# Patient Record
Sex: Female | Born: 2000
Health system: Southern US, Community
[De-identification: ages and names within clinical notes are randomized; demographics above are authoritative.]

## PROBLEM LIST (undated history)

## (undated) DIAGNOSIS — N946 Dysmenorrhea, unspecified: Secondary | ICD-10-CM

## (undated) DIAGNOSIS — T7840XA Allergy, unspecified, initial encounter: Secondary | ICD-10-CM

## (undated) DIAGNOSIS — F32A Depression, unspecified: Secondary | ICD-10-CM

## (undated) DIAGNOSIS — F329 Major depressive disorder, single episode, unspecified: Secondary | ICD-10-CM

## (undated) DIAGNOSIS — F419 Anxiety disorder, unspecified: Secondary | ICD-10-CM

## (undated) HISTORY — DX: Allergy, unspecified, initial encounter: T78.40XA

## (undated) HISTORY — DX: Dysmenorrhea, unspecified: N94.6

## (undated) HISTORY — DX: Depression, unspecified: F32.A

## (undated) HISTORY — DX: Major depressive disorder, single episode, unspecified: F32.9

## (undated) HISTORY — DX: Anxiety disorder, unspecified: F41.9

## (undated) HISTORY — PX: OTHER SURGICAL HISTORY: SHX169

## (undated) HISTORY — PX: MOLE REMOVAL: SHX2046

---
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
## 2019-02-05 ENCOUNTER — Other Ambulatory Visit: Payer: Self-pay | Admitting: Family Medicine

## 2019-04-08 ENCOUNTER — Other Ambulatory Visit: Payer: Self-pay | Admitting: Ophthalmology

## 2019-04-21 ENCOUNTER — Encounter: Payer: 59 | Admitting: Family Medicine

## 2019-05-10 ENCOUNTER — Encounter: Payer: Self-pay | Admitting: *Deleted

## 2019-05-10 ENCOUNTER — Encounter: Payer: Self-pay | Admitting: Family Medicine

## 2019-05-12 ENCOUNTER — Encounter: Payer: Self-pay | Admitting: Family Medicine

## 2019-05-12 ENCOUNTER — Ambulatory Visit (INDEPENDENT_AMBULATORY_CARE_PROVIDER_SITE_OTHER): Payer: 59 | Admitting: Family Medicine

## 2019-05-12 ENCOUNTER — Other Ambulatory Visit: Payer: Self-pay

## 2019-05-12 VITALS — BP 106/66 | HR 70 | Temp 98.4°F | Resp 14 | Ht 66.25 in | Wt 141.0 lb

## 2019-05-12 DIAGNOSIS — F411 Generalized anxiety disorder: Secondary | ICD-10-CM

## 2019-05-12 DIAGNOSIS — Z00129 Encounter for routine child health examination without abnormal findings: Secondary | ICD-10-CM | POA: Diagnosis not present

## 2019-05-12 DIAGNOSIS — Z23 Encounter for immunization: Secondary | ICD-10-CM

## 2019-05-12 MED ORDER — FLUOXETINE HCL 10 MG PO TABS: 10.0000 mg | ORAL_TABLET | Freq: Every day | ORAL | 3 refills | Status: DC

## 2019-05-12 NOTE — Patient Instructions (Addendum)
Please return in 6 weeks to recheck your mood.   Let's slowly decrease your prozac as follows: Take '10mg'$  prozac on Th and Sunday of this week; '20mg'$  all other day; Then next week, take '10mg'$  prozac on M, W, F, '20mg'$  all other days; Then take '10mg'$  MWF Saturday and Sunday, with '20mg'$  on Tu and th, Then start '10mg'$  daily.    If you have any questions or concerns, please don't hesitate to send me a message via MyChart or call the office at [phone]. Thank you for visiting with [place] today! It's our pleasure caring for you.   Preventive Care 51-54 Years Old, Female Preventive care refers to visits with your health care provider and lifestyle choices that can promote health and wellness. This includes:  A yearly physical exam. This may also be called an annual well check.  Regular dental visits and eye exams.  Immunizations.  Screening for certain conditions.  Healthy lifestyle choices, such as eating a healthy diet, getting regular exercise, not using drugs or products that contain nicotine and tobacco, and limiting alcohol use. What can I expect for my preventive care visit? Physical exam Your health care provider will check your:  Height and weight. This may be used to calculate body mass index (BMI), which tells if you are at a healthy weight.  Heart rate and blood pressure.  Skin for abnormal spots. Counseling Your health care provider may ask you questions about your:  Alcohol, tobacco, and drug use.  Emotional well-being.  Home and relationship well-being.  Sexual activity.  Eating habits.  Work and work Statistician.  Method of birth control.  Menstrual cycle.  Pregnancy history. What immunizations do I need?  Influenza (flu) vaccine  This is recommended every year. Tetanus, diphtheria, and pertussis (Tdap) vaccine  You may need a Td booster every 10 years. Varicella (chickenpox) vaccine  You may need this if you have not been vaccinated. Human  papillomavirus (HPV) vaccine  If recommended by your health care provider, you may need three doses over 6 months. Measles, mumps, and rubella (MMR) vaccine  You may need at least one dose of MMR. You may also need a second dose. Meningococcal conjugate (MenACWY) vaccine  One dose is recommended if you are age 79-21 years and a first-year college student living in a residence hall, or if you have one of several medical conditions. You may also need additional booster doses. Pneumococcal conjugate (PCV13) vaccine  You may need this if you have certain conditions and were not previously vaccinated. Pneumococcal polysaccharide (PPSV23) vaccine  You may need one or two doses if you smoke cigarettes or if you have certain conditions. Hepatitis A vaccine  You may need this if you have certain conditions or if you travel or work in places where you may be exposed to hepatitis A. Hepatitis B vaccine  You may need this if you have certain conditions or if you travel or work in places where you may be exposed to hepatitis B. Haemophilus influenzae type b (Hib) vaccine  You may need this if you have certain conditions. You may receive vaccines as individual doses or as more than one vaccine together in one shot (combination vaccines). Talk with your health care provider about the risks and benefits of combination vaccines. What tests do I need?  Blood tests  Lipid and cholesterol levels. These may be checked every 5 years starting at age 79.  Hepatitis C test.  Hepatitis B test. Screening  Diabetes screening. This  is done by checking your blood sugar (glucose) after you have not eaten for a while (fasting).  Sexually transmitted disease (STD) testing.  BRCA-related cancer screening. This may be done if you have a family history of breast, ovarian, tubal, or peritoneal cancers.  Pelvic exam and Pap test. This may be done every 3 years starting at age 71. Starting at age 86, this may be  done every 5 years if you have a Pap test in combination with an HPV test. Talk with your health care provider about your test results, treatment options, and if necessary, the need for more tests. Follow these instructions at home: Eating and drinking   Eat a diet that includes fresh fruits and vegetables, whole grains, lean protein, and low-fat dairy.  Take vitamin and mineral supplements as recommended by your health care provider.  Do not drink alcohol if: ? Your health care provider tells you not to drink. ? You are pregnant, may be pregnant, or are planning to become pregnant.  If you drink alcohol: ? Limit how much you have to 0-1 drink a day. ? Be aware of how much alcohol is in your drink. In the [place] one drink equals one 12 oz bottle of beer (355 mL), one 5 oz glass of wine (148 mL), or one 1 oz glass of hard liquor (44 mL). Lifestyle  Take daily care of your teeth and gums.  Stay active. Exercise for at least 30 minutes on 5 or more days each week.  Do not use any products that contain nicotine or tobacco, such as cigarettes, e-cigarettes, and chewing tobacco. If you need help quitting, ask your health care provider.  If you are sexually active, practice safe sex. Use a condom or other form of birth control (contraception) in order to prevent pregnancy and STIs (sexually transmitted infections). If you plan to become pregnant, see your health care provider for a preconception visit. What's next?  Visit your health care provider once a year for a well check visit.  Ask your health care provider how often you should have your eyes and teeth checked.  Stay up to date on all vaccines. This information is not intended to replace advice given to you by your health care provider. Make sure you discuss any questions you have with your health care provider. Document Released: 10/22/2001 Document Revised: 05/07/2018 Document Reviewed: 05/07/2018 Elsevier Patient Education  2020  [employer].

## 2019-05-12 NOTE — Progress Notes (Signed)
Subjective:     History was provided by the patient. Chief Complaint  Patient presents with  . Annual Exam    Not fasting  . Anxiety    Wants to discuss other medication options.Marland Kitchen GAD 7 score = 2    Angelica Li is a 18 y.o. female who is here for this well-teenvisit.  Immunization History  Administered Date(s) Administered  . DTaP 11/23/1999, 01/24/2000, 04/18/2000, 01/07/2001, 10/14/2003  . DTaP / Hep B / IPV 11/23/1999, 01/24/2000, 07/09/2000, 10/14/2003  . HPV Quadrivalent 10/17/2010, 12/18/2010, 10/28/2011  . Hepatitis A 10/14/2005, 10/17/2006  . Hepatitis B 11/13/1999, 11/23/1999, 07/09/2000  . HiB (PRP-OMP) 11/23/1999, 01/24/2000, 04/18/2000, 01/07/2001  . Hpv 10/17/2010, 12/18/2010  . Influenza,inj,Quad PF,6+ Mos 06/16/2018, 05/12/2019  . Influenza-Unspecified 06/24/2017  . MMR 10/15/2000, 10/14/2003  . Meningococcal B, OMV 12/16/2016, 01/09/2018  . Meningococcal Polysaccharide 10/17/2010, 12/16/2016  . Pneumococcal Conjugate-13 10/17/2006  . Td 10/17/2010  . Tdap 10/17/2010  . Varicella 10/15/2000, 10/14/2005   The following portions of the patient's history were reviewed and updated as appropriate: allergies, current medications, past family history, past medical history, past social history, past surgical history and problem list.  Current Issues: Current concerns include GAD: since 4th grade on prozac. Had been managed by peds psych. Has failed a few other meds. But now worries that prozac is affecting her - more flat, more apathetic but still with some social anxiety as well. She is an introvert by nature and doesn't feel that pandemic changes have too terribly affected her. She is functioning very well; online college. Getting alone with family. Sleep and appetite are fine. Wondering if she should adjust meds.  No other concerns. Feels physically well . Currently menstruating? yes; current menstrual pattern: regular on ocps for dysmenorrhea Sexually active? no ,  never has been. No currently dating Does patient snore? no   Review of Nutrition: Current diet: balanced. Feels prozac keeps her hungry Balanced diet? yes  Social Screening:  Parental relations: excellent Sibling relations: only child Discipline concerns? no Concerns regarding behavior with peers? no School performance: doing well; no concerns Secondhand smoke exposure? no  Screening Questions: Risk factors for anemia: yes - heavy menses Risk factors for vision problems: no Risk factors for hearing problems: no Risk factors for tuberculosis: no Risk factors for dyslipidemia: no Risk factors for sexually-transmitted infections: no Risk factors for alcohol/drug use:  no    Objective:     Vitals:   05/12/19 1120  BP: 106/66  Pulse: 70  Resp: 14  Temp: 98.4 F (36.9 C)  TempSrc: Tympanic  SpO2: 98%  Weight: 141 lb (64 kg)  Height: 5' 6.25" (1.683 m)   Growth parameters are noted and are appropriate for age.  General:   alert, cooperative and no distress  Gait:   normal  Skin:   normal  Oral cavity:   lips, mucosa, and tongue normal; teeth and gums normal  Eyes:   sclerae white, pupils equal and reactive, red reflex normal bilaterally  Ears:   normal bilaterally  Neck:   no adenopathy, no carotid bruit, no JVD, supple, symmetrical, trachea midline and thyroid not enlarged, symmetric, no tenderness/mass/nodules  Lungs:  clear to auscultation bilaterally  Heart:   regular rate and rhythm, S1, S2 normal, no murmur, click, rub or gallop  Abdomen:  soft, non-tender; bowel sounds normal; no masses,  no organomegaly  GU:  exam deferred     Extremities:  extremities normal, atraumatic, no cyanosis or edema  Neuro:  normal without focal findings, mental status, speech normal, alert and oriented x3, PERLA and reflexes normal and symmetric    Assessment:     ICD-10-CM   1. Well adolescent visit  Z00.129   2. Generalized anxiety disorder  F41.1   3. Need for immunization  against influenza  Z23 Flu Vaccine QUAD 36+ mos IM      Plan:    1. Anticipatory guidance discussed. Gave handout on well-child issues at this age. Specific topics reviewed: drugs, ETOH, and tobacco, importance of regular dental care, importance of regular exercise, importance of varied diet, limit TV, media violence, minimize junk food, seat belts and sex.  2.  Weight management:  The patient was counseled regarding nutrition and physical activity.  3. Development: appropriate for age  59. Immunizations today: per orders. Flu shot. Other imms are reviewed and all are up to date.  History of previous adverse reactions to immunizations? No  5. GAD: discussed possible explanations for change in affect: drug side effect, worsening mood problem, stressors from pandemic, social anxiety d/o: elect to slowly wean prozac to see if helps to lower dose with close f/u. Restart therapy. Then consider med changes based upon her follow up. Patient understands and agrees with care plan.   Follow-up visit in 1 year for next well child visit, or sooner as needed.

## 2019-06-03 ENCOUNTER — Encounter: Payer: Self-pay | Admitting: Family Medicine

## 2019-06-03 DIAGNOSIS — F411 Generalized anxiety disorder: Secondary | ICD-10-CM

## 2019-06-23 ENCOUNTER — Encounter: Payer: Self-pay | Admitting: Family Medicine

## 2019-06-23 ENCOUNTER — Ambulatory Visit (INDEPENDENT_AMBULATORY_CARE_PROVIDER_SITE_OTHER): Payer: 59 | Admitting: Family Medicine

## 2019-06-23 DIAGNOSIS — F320 Major depressive disorder, single episode, mild: Secondary | ICD-10-CM

## 2019-06-23 DIAGNOSIS — F411 Generalized anxiety disorder: Secondary | ICD-10-CM

## 2019-06-23 NOTE — Progress Notes (Signed)
Virtual Visit via Video Note  SUBJECTIVE CC:  Chief Complaint  Patient presents with  . Anxiety    Feels as though lower dose is working well, has had some "off days" but able to manage. GAD - 7 today is 1.    I connected with Angelica Li on 06/23/19 at 10:40 AM EDT by a video enabled telemedicine application and verified that I am speaking with the correct person using two identifiers. Location patient: Home Location provider: [facility] [place] participating in the virtual visit: ARLEDA LAHIFF, Leamon Arnt, MD Lilli Light, [place] discussed the limitations of evaluation and management by telemedicine and the availability of in person appointments. The patient expressed understanding and agreed to proceed.  HPI: Angelica Li is a 18 y.o. female who was contacted today to address the problems listed above in the chief complaint/mood.  F/u GAD and med side effects. See last note; has long h/o prozac for GAD but was feeling more apathetic. We weaned prozac back to 10mg  from 20mg : she feels more clear headed and less irritable. Still with some depressive sxs on certain days and some anxiety, although she has been able to handle stressors well and is emoting better. At this point, she would like to see Thayer Headings, NP, psychiatry to help manage her sxs and medications. She also has set up an appt with a therapist.  Depression screen [facility] 2/9 12/21/2018 11/20/2017  Decreased Interest 0 0  Down, Depressed, Hopeless 0 0  PHQ - 2 Score 0 0  Altered sleeping - 0  Tired, decreased energy - 0  Change in appetite - 0  Feeling bad or failure about yourself  - 0  Trouble concentrating - 0  Moving slowly or fidgety/restless - 0  Suicidal thoughts - 0  PHQ-9 Score - 0   GAD 7 : Generalized Anxiety Score 06/23/2019 05/12/2019 12/21/2018  Nervous, Anxious, on Edge 1 0 0  Control/stop worrying 0 0 1  Worry too much - different things 0 0 1  Trouble  relaxing 0 1 0  Restless 0 0 0  Easily annoyed or irritable 0 1 0  Afraid - awful might happen 0 0 0  Total GAD 7 Score 1 2 2   Anxiety Difficulty Not difficult at all Not difficult at all Not difficult at all     ASSESSMENT 1. Generalized anxiety disorder   2. Depression, major, single episode, mild (HCC)      GAD:  Improved on lower dose of prozac but still with active sxs. ? Depression as well. Refer to psych as requested for further evaluation and treatment.  I discussed the assessment and treatment plan with the patient. The patient was provided an opportunity to ask questions and all were answered. The patient agreed with the plan and demonstrated an understanding of the instructions.   The patient was advised to call back or seek an in-person evaluation if the symptoms worsen or if the condition fails to improve as anticipated. Follow up: Return in about 10 months (around 04/22/2020) for complete physical.  Visit date not found  No orders of the defined types were placed in this encounter.     I reviewed the patients updated PMH, FH, and SocHx.    Patient Active Problem List   Diagnosis Date Noted  . Oral contraceptive use 11/20/2017  . Dysmenorrhea 11/20/2017  . Generalized anxiety disorder 08/17/2015  . Keratosis pilaris 02/20/2012  Current Meds  Medication Sig  . cetirizine (ZYRTEC) 10 MG tablet Take 1 tablet (10 mg total) by mouth daily as needed for allergies.  Marland Kitchen FLUoxetine (PROZAC) 10 MG tablet Take 1 tablet (10 mg total) by mouth daily.  Lenda Kelp 1.5/30 1.5-30 MG-MCG tablet TAKE 1 TABLET BY MOUTH  DAILY AS DIRECTED  . MULTIPLE VITAMIN PO Take 1 capsule by mouth daily.    Allergies: Patient has No Known Allergies. Family History: Patient family history includes ADD / ADHD in her cousin; Anxiety disorder in her maternal aunt and maternal grandmother; Depression in her maternal aunt, maternal grandmother, and mother; Inflammatory bowel disease in her maternal  aunt; Migraines in her maternal aunt; Ulcers in her maternal grandmother. Social History:  Patient  reports that she has never smoked. She has never used smokeless tobacco. She reports that she does not drink alcohol or use drugs.  Review of Systems: Constitutional: Negative for fever malaise or anorexia Cardiovascular: negative for chest pain Respiratory: negative for SOB or persistent cough Gastrointestinal: negative for abdominal pain  OBJECTIVE/OBSERVATIONS: General: no acute distress, well appearing, no apparent distress, well groomed Psych:  Alert and oriented x 3,normal mood, behavior, speech, dress, and thought processes.   Leamon Arnt, MD

## 2019-07-14 ENCOUNTER — Ambulatory Visit (INDEPENDENT_AMBULATORY_CARE_PROVIDER_SITE_OTHER): Payer: 59 | Admitting: Adult Health

## 2019-07-14 ENCOUNTER — Encounter: Payer: Self-pay | Admitting: Adult Health

## 2019-07-14 ENCOUNTER — Other Ambulatory Visit: Payer: Self-pay

## 2019-07-14 DIAGNOSIS — F331 Major depressive disorder, recurrent, moderate: Secondary | ICD-10-CM | POA: Diagnosis not present

## 2019-07-14 DIAGNOSIS — F411 Generalized anxiety disorder: Secondary | ICD-10-CM | POA: Diagnosis not present

## 2019-07-14 MED ORDER — BUPROPION HCL ER (XL) 150 MG PO TB24: 150.0000 mg | ORAL_TABLET | Freq: Every day | ORAL | 2 refills | Status: DC

## 2019-07-14 NOTE — Progress Notes (Signed)
[facility] MD/PA/NP Initial Note  07/14/2019 8:33 AM Angelica Li  MRN:  [id number]  Chief Complaint:   HPI:  Describes mood today as "ok". Pleasant. Mood symptoms - reports a little depression. Denies anxiety. Getting irritable at times. Stating "I don't always think before I speak". Increased ruminations with weaning Prozac down to 10mg  daily. Stable interest and motivation. Taking medications as prescribed.  Energy levels stable. Active, has a regular exercise routine. Yoga. Walking dogs daily. Lost job in April. Enjoys some usual interests and activities. Lives with parents and brother 13 - 2 dogs and 1 cat. Spending time with family. Appetite adequate. Weight stable. Sleeps well most nights. Averages 7 to 8 hours. Having trouble getting to sleep. Mind "going" when she lays down.  Focus and concentration stable. Completing tasks. Managing aspects of household. Student at [employer] - online classes - "difficult". Major "animal science".  Denies SI or HI. Denies AH or VH. [name] - therapist for 2 and 1/2 years.  Visit Diagnosis:    ICD-10-CM   1. Generalized anxiety disorder  F41.1 buPROPion (WELLBUTRIN XL) 150 MG 24 hr tablet  2. Major depressive disorder, recurrent episode, moderate (HCC)  F33.1 buPROPion (WELLBUTRIN XL) 150 MG 24 hr tablet    Past Psychiatric History: Denies psychiatric hospitalizations  Past Medical History:  Past Medical History:  Diagnosis Date  . Anxiety   . Depression   . Dysmenorrhea 11/20/2017    Past Surgical History:  Procedure Laterality Date  . MOLE REMOVAL    . tubes in ears     twice when younger, none now    Family Psychiatric History: Unknown  Family History:  Family History  Problem Relation Age of Onset  . Inflammatory bowel disease Maternal Aunt   . Anxiety disorder Maternal Aunt   . Depression Maternal Aunt   . Migraines Maternal Aunt   . Ulcers Maternal Grandmother   . Anxiety disorder Maternal Grandmother   . Depression  Maternal Grandmother   . Depression Mother   . ADD / ADHD Cousin     Social History:  Social History   Socioeconomic History  . Marital status: Single    Spouse name: Not on file  . Number of children: Not on file  . Years of education: Not on file  . Highest education level: Not on file  Occupational History  . 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 Financial resource strain: Not on file  . Food insecurity    Worry: Not on file    Inability: Not on file  . Transportation needs    Medical: Not on file    Non-medical: Not on file  Tobacco Use  . Smoking status: Never Smoker  . Smokeless tobacco: Never Used  Substance and Sexual Activity  . Alcohol use: No  . Drug use: No  . Sexual activity: Never    Birth control/protection: Pill  Lifestyle  . Physical activity    Days per week: Not on file    Minutes per session: Not on file  . Stress: Not on file  Relationships  . Social Herbalist on phone: Not on file    Gets together: Not on file    Attends religious service: Not on file    Active member of club or organization: Not on file    Attends meetings of clubs or organizations: Not on file    Relationship status: Not on file  Other Topics Concern  .  Not on file  Social History Narrative  . Not on file    Allergies: No Known Allergies  Metabolic Disorder Labs: No results found for: HGBA1C, MPG No results found for: PROLACTIN No results found for: CHOL, TRIG, HDL, CHOLHDL, VLDL, LDLCALC No results found for: TSH  Therapeutic Level Labs: No results found for: LITHIUM No results found for: VALPROATE No components found for:  CBMZ  Current Medications: Current Outpatient Medications  Medication Sig Dispense Refill  . albuterol (VENTOLIN HFA) 108 (90 Base) MCG/ACT inhaler     . buPROPion (WELLBUTRIN XL) 150 MG 24 hr tablet Take 1 tablet (150 mg total) by mouth daily. 30 tablet 2  . cetirizine (ZYRTEC) 10 MG tablet Take 1  tablet (10 mg total) by mouth daily as needed for allergies. 30 tablet 0  . FLUoxetine (PROZAC) 10 MG tablet Take 1 tablet (10 mg total) by mouth daily. 90 tablet 3  . JUNEL 1.5/30 1.5-30 MG-MCG tablet TAKE 1 TABLET BY MOUTH  DAILY AS DIRECTED 63 tablet 2  . MULTIPLE VITAMIN PO Take 1 capsule by mouth daily.     No current facility-administered medications for this visit.     Medication Side Effects: none  Orders placed this visit:  No orders of the defined types were placed in this encounter.   Psychiatric Specialty Exam:  Review of Systems  Neurological: Negative for tremors and weakness.  Psychiatric/Behavioral: Positive for depression. The patient is nervous/anxious.     There were no vitals taken for this visit.There is no height or weight on file to calculate BMI.  General Appearance: Neat and Well Groomed  Eye Contact:  Good  Speech:  Normal Rate  Volume:  Normal  Mood:  Anxious, Depressed and Irritable  Affect:  Congruent  Thought Process:  Coherent  Orientation:  Full (Time, Place, and Person)  Thought Content: Logical   Suicidal Thoughts:  No  Homicidal Thoughts:  No  Memory:  WNL  Judgement:  Good  Insight:  Good  Psychomotor Activity:  Normal  Concentration:  Concentration: Good  Recall:  Good  Fund of Knowledge: Good  Language: Good  Assets:  Communication Skills Desire for Improvement Financial Resources/Insurance Housing Intimacy Leisure Time Physical Health Resilience Social Support Talents/Skills Transportation Vocational/Educational  ADL's:  Intact  Cognition: WNL  Prognosis:  Good   Screenings:  GAD-7     Office Visit from 06/23/2019 in [place] Visit from 05/12/2019 in [place] Visit from 12/21/2018 in [facility] [place]  Total GAD-7 Score  1  2  2     PHQ2-9     Office Visit from 12/21/2018 in [place] from 11/20/2017  in [place]  PHQ-2 Total Score  0  0  PHQ-9 Total Score  -  0      Receiving Psychotherapy: Yes   Treatment Plan/Recommendations:   Plan:  1. Continue Prozac 10mg  daily 2. Add Wellbutrin XL 150mg  daily  Continue therapy  RTC 4 weeks  Patient advised to contact office with any questions, adverse effects, or acute worsening in signs and symptoms.   Aloha Gell, NP

## 2019-08-03 ENCOUNTER — Encounter: Payer: Self-pay | Admitting: Family Medicine

## 2019-08-04 ENCOUNTER — Other Ambulatory Visit: Payer: Self-pay

## 2019-08-04 MED ORDER — NORETHINDRONE ACET-ETHINYL EST 1.5-30 MG-MCG PO TABS: 1.0000 | ORAL_TABLET | Freq: Every day | ORAL | 2 refills | Status: DC

## 2019-08-11 ENCOUNTER — Ambulatory Visit: Payer: 59 | Admitting: Adult Health

## 2019-08-23 ENCOUNTER — Ambulatory Visit (INDEPENDENT_AMBULATORY_CARE_PROVIDER_SITE_OTHER): Payer: 59 | Admitting: Adult Health

## 2019-08-23 ENCOUNTER — Encounter: Payer: Self-pay | Admitting: Adult Health

## 2019-08-23 ENCOUNTER — Other Ambulatory Visit: Payer: Self-pay

## 2019-08-23 DIAGNOSIS — F411 Generalized anxiety disorder: Secondary | ICD-10-CM

## 2019-08-23 DIAGNOSIS — F331 Major depressive disorder, recurrent, moderate: Secondary | ICD-10-CM | POA: Diagnosis not present

## 2019-08-23 MED ORDER — BUPROPION HCL ER (XL) 150 MG PO TB24: 150.0000 mg | ORAL_TABLET | Freq: Every day | ORAL | 2 refills | Status: DC

## 2019-08-23 NOTE — Progress Notes (Signed)
Angelica Li [id number] Mar 04, 2001 18 y.o.  Subjective:   Patient ID:  Angelica Li is a 18 y.o. (DOB Mar 15, 2001) female.  Chief Complaint: No chief complaint on file.   HPI Angelica Li presents to the office today for follow-up of GAD and MDD.  Describes mood today as "ok". Pleasant. Mood symptoms - denies depression, anxiety, or irritability. Stating "I'm feeling a lot better". Decreased ruminations with Prozac. Improved energy throughout the day - not wanting to nap. Parents supportive. Spending holidays at home. Stable interest and motivation. Taking medications as prescribed.  Energy levels stable. Active, has a regular exercise routine. Yoga. Walking dogs daily. Enjoys some usual interests and activities. Single. Lives with parents and brother 72 - 2 dogs and 1 cat. Spending time with family. Appetite adequate. Weight stable. Sleeps well most nights. Averages 7 to 8 hours. Some "rumination" at times. Plans to take Wellbutrin earlier in the morning.  Focus and concentration stable. Completing tasks. Managing aspects of household. Student at [employer] - finished fall semester last week.   Denies SI or HI. Denies AH or VH. Angelica Li - therapist for 2 and 1/2 years. Sees tomorrow.   GAD-7     Office Visit from 06/23/2019 in [place] Visit from 05/12/2019 in [place] Visit from 12/21/2018 in [place]  Total GAD-7 Score  1  2  2     PHQ2-9     Office Visit from 12/21/2018 in [facility] Visit from 11/20/2017 in [place]  PHQ-2 Total Score  0  0  PHQ-9 Total Score  -  0       Review of Systems:  Review of Systems  Musculoskeletal: Negative for gait problem.  Neurological: Negative for tremors.  Psychiatric/Behavioral:       Please refer to HPI    Medications: I have reviewed the patient's current  medications.  Current Outpatient Medications  Medication Sig Dispense Refill  . albuterol (VENTOLIN HFA) 108 (90 Base) MCG/ACT inhaler     . buPROPion (WELLBUTRIN XL) 150 MG 24 hr tablet Take 1 tablet (150 mg total) by mouth daily. 30 tablet 2  . cetirizine (ZYRTEC) 10 MG tablet Take 1 tablet (10 mg total) by mouth daily as needed for allergies. 30 tablet 0  . FLUoxetine (PROZAC) 10 MG tablet Take 1 tablet (10 mg total) by mouth daily. 90 tablet 3  . MULTIPLE VITAMIN PO Take 1 capsule by mouth daily.    . Norethindrone Acetate-Ethinyl Estradiol (JUNEL 1.5/30) 1.5-30 MG-MCG tablet Take 1 tablet by mouth daily. as directed 63 tablet 2   No current facility-administered medications for this visit.    Medication Side Effects: None  Allergies: No Known Allergies  Past Medical History:  Diagnosis Date  . Anxiety   . Depression   . Dysmenorrhea 11/20/2017    Family History  Problem Relation Age of Onset  . Inflammatory bowel disease Maternal Aunt   . Anxiety disorder Maternal Aunt   . Depression Maternal Aunt   . Migraines Maternal Aunt   . Ulcers Maternal Grandmother   . Anxiety disorder Maternal Grandmother   . Depression Maternal Grandmother   . Depression Mother   . ADD / ADHD Cousin     Social History   Socioeconomic History  . Marital status: Single    Spouse name: Not on file  . Number of children: Not on file  . Years of  education: Not on file  . Highest education level: Not on file  Occupational History  . Occupation: college student: Angelica Li, 2019  Tobacco Use  . Smoking status: Never Smoker  . Smokeless tobacco: Never Used  Substance and Sexual Activity  . Alcohol use: No  . Drug use: No  . Sexual activity: Never    Birth control/protection: Pill  Other Topics Concern  . Not on file  Social History Narrative  . Not on file   Social Determinants of Health   Financial Resource Strain:   . Difficulty of Paying Living Expenses: Not on file  Food  Insecurity:   . Worried About Charity fundraiser in the Last Year: Not on file  . Ran Out of Food in the Last Year: Not on file  Transportation Needs:   . Lack of Transportation (Medical): Not on file  . Lack of Transportation (Non-Medical): Not on file  Physical Activity:   . Days of Exercise per Week: Not on file  . Minutes of Exercise per Session: Not on file  Stress:   . Feeling of Stress : Not on file  Social Connections:   . Frequency of Communication with Friends and Family: Not on file  . Frequency of Social Gatherings with Friends and Family: Not on file  . Attends Religious Services: Not on file  . Active Member of Clubs or Organizations: Not on file  . Attends Archivist Meetings: Not on file  . Marital Status: Not on file  Intimate Partner Violence:   . Fear of Current or Ex-Partner: Not on file  . Emotionally Abused: Not on file  . Physically Abused: Not on file  . Sexually Abused: Not on file    Past Medical History, Surgical history, Social history, and Family history were reviewed and updated as appropriate.   Please see review of systems for further details on the patient's review from today.   Objective:   Physical Exam:  There were no vitals taken for this visit.  Physical Exam Constitutional:      General: She is not in acute distress.    Appearance: She is well-developed.  Musculoskeletal:        General: No deformity.  Neurological:     Mental Status: She is alert and oriented to person, place, and time.     Coordination: Coordination normal.  Psychiatric:        Attention and Perception: Attention and perception normal. She does not perceive auditory or visual hallucinations.        Mood and Affect: Mood normal. Mood is not anxious or depressed. Affect is not labile, blunt, angry or inappropriate.        Speech: Speech normal.        Behavior: Behavior normal.        Thought Content: Thought content normal. Thought content is not  paranoid or delusional. Thought content does not include homicidal or suicidal ideation. Thought content does not include homicidal or suicidal plan.        Cognition and Memory: Cognition and memory normal.        Judgment: Judgment normal.     Comments: Insight intact     Lab Review:     Component Value Date/Time   NA 139 11/27/2015 1926   K 3.4 (L) 11/27/2015 1926   CL 101 11/27/2015 1926   GLUCOSE 75 11/27/2015 1926   BUN 13 11/27/2015 1926   CREATININE 0.60 11/27/2015 1926   PROT 6.8  02/28/2011 1128   ALBUMIN 4.5 02/28/2011 1128   AST 19 02/28/2011 1128   ALT 9 02/28/2011 1128   ALKPHOS 168 02/28/2011 1128   BILITOT 0.4 02/28/2011 1128       Component Value Date/Time   WBC 8.6 11/27/2015 1900   RBC 4.23 11/27/2015 1900   HGB 13.3 11/27/2015 1926   HCT 39.0 11/27/2015 1926   PLT 159 11/27/2015 1900   MCV 91.0 11/27/2015 1900   MCH 31.4 11/27/2015 1900   MCHC 34.5 11/27/2015 1900   RDW 12.4 11/27/2015 1900   LYMPHSABS 0.9 (L) 11/27/2015 1900   MONOABS 0.6 11/27/2015 1900   EOSABS 0.0 11/27/2015 1900   BASOSABS 0.0 11/27/2015 1900    No results found for: POCLITH, LITHIUM   No results found for: PHENYTOIN, PHENOBARB, VALPROATE, CBMZ   .res Assessment: Plan:   Plan:  1. Prozac 10mg  daily 2. Wellbutrin XL 150mg  daily  Continue therapy   RTC 8 weeks  Patient advised to contact office with any questions, adverse effects, or acute worsening in signs and symptoms. Diagnoses and all orders for this visit:  Generalized anxiety disorder  Major depressive disorder, recurrent episode, moderate ([place])     Please see After Visit Summary for patient specific instructions.  No future appointments.  No orders of the defined types were placed in this encounter.   -------------------------------

## 2019-10-19 NOTE — Telephone Encounter (Signed)
Error

## 2019-10-25 ENCOUNTER — Other Ambulatory Visit: Payer: Self-pay

## 2019-10-25 ENCOUNTER — Ambulatory Visit (INDEPENDENT_AMBULATORY_CARE_PROVIDER_SITE_OTHER): Payer: 59 | Admitting: Adult Health

## 2019-10-25 ENCOUNTER — Encounter: Payer: Self-pay | Admitting: Adult Health

## 2019-10-25 ENCOUNTER — Ambulatory Visit: Payer: 59 | Admitting: Adult Health

## 2019-10-25 DIAGNOSIS — F411 Generalized anxiety disorder: Secondary | ICD-10-CM

## 2019-10-25 DIAGNOSIS — F331 Major depressive disorder, recurrent, moderate: Secondary | ICD-10-CM

## 2019-10-25 MED ORDER — BUPROPION HCL ER (XL) 150 MG PO TB24: 150.0000 mg | ORAL_TABLET | Freq: Every day | ORAL | 3 refills | Status: AC

## 2019-10-25 NOTE — Progress Notes (Signed)
Angelica Li [id number] 07/02/2001 19 y.o.  Subjective:   Patient ID:  Angelica Li is a 19 y.o. (DOB Aug 04, 2001) female.  Chief Complaint:  Chief Complaint  Patient presents with  . Anxiety  . Depression    HPI  Angelica Li presents to the office today for follow-up of GAD and MDD.  Describes mood today as "ok". Pleasant. Mood symptoms - decreased depression, anxiety, or irritability. Stating "I'm doing pretty good". Worries some - "able to let things go". Doesn't let things "ruin her whole day". Taking two classes at [employer] - biology and calculus. Has applied to [employer] for the fall semester. Parents supportive. Stable interest and motivation. Taking medications as prescribed.  Energy levels stable. Active, has a regular exercise routine. Yoga. Horse riding. Walking dogs daily. Enjoys some usual interests and activities. Single. Lives at home with parents and brother 77 - 2 dogs and 1 cat. Spending time with family. Appetite adequate. Weight stable 140. Sleeps well most nights. Averages 7 to 8 hours.  Focus and concentration stable. Completing tasks. Managing aspects of household. Student at [employer].   Denies SI or HI. Denies AH or VH. Angelica Li - therapist for 2 and 1/2 years.    GAD-7     Office Visit from 06/23/2019 in [place] Visit from 05/12/2019 in [place] Visit from 12/21/2018 in [facility]  Total GAD-7 Score  1  2  2     PHQ2-9     Office Visit from 12/21/2018 in [place] Visit from 11/20/2017 in [place]  PHQ-2 Total Score  0  0  PHQ-9 Total Score  --  0       Review of Systems:  Review of Systems  Musculoskeletal: Negative for gait problem.  Neurological: Negative for tremors.  Psychiatric/Behavioral:       Please refer to HPI    Medications: I have reviewed the patient's current  medications.  Current Outpatient Medications  Medication Sig Dispense Refill  . albuterol (VENTOLIN HFA) 108 (90 Base) MCG/ACT inhaler     . buPROPion (WELLBUTRIN XL) 150 MG 24 hr tablet Take 1 tablet (150 mg total) by mouth daily. 90 tablet 3  . cetirizine (ZYRTEC) 10 MG tablet Take 1 tablet (10 mg total) by mouth daily as needed for allergies. 30 tablet 0  . MULTIPLE VITAMIN PO Take 1 capsule by mouth daily.    . Norethindrone Acetate-Ethinyl Estradiol (JUNEL 1.5/30) 1.5-30 MG-MCG tablet Take 1 tablet by mouth daily. as directed 63 tablet 2   No current facility-administered medications for this visit.    Medication Side Effects: None  Allergies: No Known Allergies  Past Medical History:  Diagnosis Date  . Anxiety   . Depression   . Dysmenorrhea 11/20/2017    Family History  Problem Relation Age of Onset  . Inflammatory bowel disease Maternal Aunt   . Anxiety disorder Maternal Aunt   . Depression Maternal Aunt   . Migraines Maternal Aunt   . Ulcers Maternal Grandmother   . Anxiety disorder Maternal Grandmother   . Depression Maternal Grandmother   . Depression Mother   . ADD / ADHD Cousin     Social History   Socioeconomic History  . Marital status: Single    Spouse name: Not on file  . Number of children: Not on file  . Years of education: Not on file  . Highest education level: Not  on file  Occupational History  . 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 Smoking status: Never Smoker  . Smokeless tobacco: Never Used  Substance and Sexual Activity  . Alcohol use: No  . Drug use: No  . Sexual activity: Never    Birth control/protection: Pill  Other Topics Concern  . Not on file  Social History Narrative  . Not on file   Social Determinants of Health   Financial Resource Strain:   . Difficulty of Paying Living Expenses: Not on file  Food Insecurity:   . Worried About Charity fundraiser in the Last Year: Not on file  . Ran Out of  Food in the Last Year: Not on file  Transportation Needs:   . Lack of Transportation (Medical): Not on file  . Lack of Transportation (Non-Medical): Not on file  Physical Activity:   . Days of Exercise per Week: Not on file  . Minutes of Exercise per Session: Not on file  Stress:   . Feeling of Stress : Not on file  Social Connections:   . Frequency of Communication with Friends and Family: Not on file  . Frequency of Social Gatherings with Friends and Family: Not on file  . Attends Religious Services: Not on file  . Active Member of Clubs or Organizations: Not on file  . Attends Archivist Meetings: Not on file  . Marital Status: Not on file  Intimate Partner Violence:   . Fear of Current or Ex-Partner: Not on file  . Emotionally Abused: Not on file  . Physically Abused: Not on file  . Sexually Abused: Not on file    Past Medical History, Surgical history, Social history, and Family history were reviewed and updated as appropriate.   Please see review of systems for further details on the patient's review from today.   Objective:   Physical Exam:  There were no vitals taken for this visit.  Physical Exam Constitutional:      General: She is not in acute distress.    Appearance: She is well-developed.  Musculoskeletal:        General: No deformity.  Neurological:     Mental Status: She is alert and oriented to person, place, and time.     Coordination: Coordination normal.  Psychiatric:        Attention and Perception: Attention and perception normal. She does not perceive auditory or visual hallucinations.        Mood and Affect: Mood normal. Mood is not anxious or depressed. Affect is not labile, blunt, angry or inappropriate.        Speech: Speech normal.        Behavior: Behavior normal.        Thought Content: Thought content normal. Thought content is not paranoid or delusional. Thought content does not include homicidal or suicidal ideation. Thought  content does not include homicidal or suicidal plan.        Cognition and Memory: Cognition and memory normal.        Judgment: Judgment normal.     Comments: Insight intact     Lab Review:     Component Value Date/Time   NA 139 11/27/2015 1926   K 3.4 (L) 11/27/2015 1926   CL 101 11/27/2015 1926   GLUCOSE 75 11/27/2015 1926   BUN 13 11/27/2015 1926   CREATININE 0.60 11/27/2015 1926   PROT 6.8 02/28/2011 1128   ALBUMIN 4.5 02/28/2011 1128  AST 19 02/28/2011 1128   ALT 9 02/28/2011 1128   ALKPHOS 168 02/28/2011 1128   BILITOT 0.4 02/28/2011 1128       Component Value Date/Time   WBC 8.6 11/27/2015 1900   RBC 4.23 11/27/2015 1900   HGB 13.3 11/27/2015 1926   HCT 39.0 11/27/2015 1926   PLT 159 11/27/2015 1900   MCV 91.0 11/27/2015 1900   MCH 31.4 11/27/2015 1900   MCHC 34.5 11/27/2015 1900   RDW 12.4 11/27/2015 1900   LYMPHSABS 0.9 (L) 11/27/2015 1900   MONOABS 0.6 11/27/2015 1900   EOSABS 0.0 11/27/2015 1900   BASOSABS 0.0 11/27/2015 1900    No results found for: POCLITH, LITHIUM   No results found for: PHENYTOIN, PHENOBARB, VALPROATE, CBMZ   .res Assessment: Plan:    Plan:  1. Wellbutrin XL 150mg  daily  Continue therapy   RTC 4 months  Patient advised to contact office with any questions, adverse effects, or acute worsening in signs and symptoms. Diagnoses and all orders for this visit:  Angelica Li was seen today for anxiety and depression.  Diagnoses and all orders for this visit:  Generalized anxiety disorder -     buPROPion (WELLBUTRIN XL) 150 MG 24 hr tablet; Take 1 tablet (150 mg total) by mouth daily.  Major depressive disorder, recurrent episode, moderate (HCC) -     buPROPion (WELLBUTRIN XL) 150 MG 24 hr tablet; Take 1 tablet (150 mg total) by mouth daily.     Please see After Visit Summary for patient specific instructions.  No future appointments.  No orders of the defined types were placed in this  encounter.   -------------------------------

## 2019-11-22 ENCOUNTER — Telehealth: Payer: 59 | Admitting: Physician Assistant

## 2019-11-22 DIAGNOSIS — R399 Unspecified symptoms and signs involving the genitourinary system: Secondary | ICD-10-CM

## 2019-11-22 MED ORDER — CEPHALEXIN 500 MG PO CAPS: 500.0000 mg | ORAL_CAPSULE | Freq: Four times a day (QID) | ORAL | 0 refills | Status: AC

## 2019-11-22 NOTE — Progress Notes (Signed)
We are sorry that you are not feeling well.  Here is how we plan to help!  Based on what you shared with me it looks like you most likely have a simple urinary tract infection.  A UTI (Urinary Tract Infection) is a bacterial infection of the bladder.  Most cases of urinary tract infections are simple to treat but a key part of your care is to encourage you to drink plenty of fluids and watch your symptoms carefully.  I have prescribed Keflex 500 mg twice a day for 7 days.  Your symptoms should gradually improve. Call us if the burning in your urine worsens, you develop worsening fever, back pain or pelvic pain or if your symptoms do not resolve after completing the antibiotic.  Urinary tract infections can be prevented by drinking plenty of water to keep your body hydrated.  Also be sure when you wipe, wipe from front to back and don't hold it in!  If possible, empty your bladder every 4 hours.  Your e-visit answers were reviewed by a board certified advanced clinical practitioner to complete your personal care plan.  Depending on the condition, your plan could have included both over the counter or prescription medications.  If there is a problem please reply  once you have received a response from your provider.  Your safety is important to [place].  If you have drug allergies check your prescription carefully.    You can use MyChart to ask questions about today's visit, request a non-urgent call back, or ask for a work or school excuse for 24 hours related to this e-Visit. If it has been greater than 24 hours you will need to follow up with your provider, or enter a new e-Visit to address those concerns.   You will get an e-mail in the next two days asking about your experience.  I hope that your e-visit has been valuable and will speed your recovery. Thank you for using e-visits.  Approximately 5 minutes of time have been spent researching, coordinating, and implementing care for this patient  today.

## 2019-11-30 ENCOUNTER — Other Ambulatory Visit: Payer: Self-pay

## 2019-11-30 ENCOUNTER — Encounter: Payer: Self-pay | Admitting: Physician Assistant

## 2019-11-30 ENCOUNTER — Ambulatory Visit (INDEPENDENT_AMBULATORY_CARE_PROVIDER_SITE_OTHER): Payer: 59 | Admitting: Physician Assistant

## 2019-11-30 VITALS — BP 120/80 | HR 86 | Temp 98.4°F | Ht 66.25 in | Wt 142.5 lb

## 2019-11-30 DIAGNOSIS — R350 Frequency of micturition: Secondary | ICD-10-CM | POA: Diagnosis not present

## 2019-11-30 LAB — POCT URINALYSIS DIPSTICK
Bilirubin, UA: NEGATIVE
Blood, UA: POSITIVE
Glucose, UA: NEGATIVE
Ketones, UA: NEGATIVE
Leukocytes, UA: NEGATIVE
Nitrite, UA: NEGATIVE
Protein, UA: NEGATIVE
Spec Grav, UA: <=1.005 — AB (ref 1.010–1.025)
Urobilinogen, UA: 0.2 E.U./dL
pH, UA: 7.5 (ref 5.0–8.0)

## 2019-11-30 MED ORDER — NITROFURANTOIN MONOHYD MACRO 100 MG PO CAPS: 100.0000 mg | ORAL_CAPSULE | Freq: Two times a day (BID) | ORAL | 0 refills | Status: DC

## 2019-11-30 NOTE — Progress Notes (Signed)
Angelica Li is a 19 y.o. female here for a follow up of a pre-existing problem.  I acted as a Education administrator for [employer], PA-C Anselmo Pickler, LPN  History of Present Illness:   Chief Complaint  Patient presents with  . Urinary Frequency  . pelvic pressure    HPI   Urinary frequency Patient did a E visit on 11/22/2019 for UTI symptoms.  She was started on Keflex 500 mg 4 times daily for 7 days.  She took this medication as prescribed.  She states that overall her symptoms have just slightly improved.  She denies: Fever, chills, flank pain, back pain, nausea, vomiting, malaise.  Denies history of kidney stone or kidney infection.  Denies any vaginal discharge or concerns for STDs.  She is currently on her period.   Past Medical History:  Diagnosis Date  . Anxiety   . Depression   . Dysmenorrhea 11/20/2017     Social History   Socioeconomic History  . Marital status: Single    Spouse name: Not on file  . Number of children: Not on file  . Years of education: Not on file  . Highest education level: Not on file  Occupational History  . Occupation: college student: [employer], 2019  Tobacco Use  . Smoking status: Never Smoker  . Smokeless tobacco: Never Used  Substance and Sexual Activity  . Alcohol use: No  . Drug use: No  . Sexual activity: Never    Birth control/protection: Pill  Other Topics Concern  . Not on file  Social History Narrative  . Not on file   Social Determinants of Health   Financial Resource Strain:   . Difficulty of Paying Living Expenses:   Food Insecurity:   . Worried About Charity fundraiser in the Last Year:   . Arboriculturist in the Last Year:   Transportation Needs:   . Film/video editor (Medical):   Marland Kitchen Lack of Transportation (Non-Medical):   Physical Activity:   . Days of Exercise per Week:   . Minutes of Exercise per Session:   Stress:   . Feeling of Stress :   Social Connections:   . Frequency of Communication with Friends  and Family:   . Frequency of Social Gatherings with Friends and Family:   . Attends Religious Services:   . Active Member of Clubs or Organizations:   . Attends Archivist Meetings:   Marland Kitchen Marital Status:   Intimate Partner Violence:   . Fear of Current or Ex-Partner:   . Emotionally Abused:   Marland Kitchen Physically Abused:   . Sexually Abused:     Past Surgical History:  Procedure Laterality Date  . MOLE REMOVAL    . tubes in ears     twice when younger, none now    Family History  Problem Relation Age of Onset  . Inflammatory bowel disease Maternal Aunt   . Anxiety disorder Maternal Aunt   . Depression Maternal Aunt   . Migraines Maternal Aunt   . Ulcers Maternal Grandmother   . Anxiety disorder Maternal Grandmother   . Depression Maternal Grandmother   . Depression Mother   . ADD / ADHD Cousin     No Known Allergies  Current Medications:   Current Outpatient Medications:  .  albuterol (VENTOLIN HFA) 108 (90 Base) MCG/ACT inhaler, , Disp: , Rfl:  .  buPROPion (WELLBUTRIN XL) 150 MG 24 hr tablet, Take 1 tablet (150 mg total) by mouth  daily., Disp: 90 tablet, Rfl: 3 .  cetirizine (ZYRTEC) 10 MG tablet, Take 1 tablet (10 mg total) by mouth daily as needed for allergies., Disp: 30 tablet, Rfl: 0 .  MULTIPLE VITAMIN PO, Take 1 capsule by mouth daily., Disp: , Rfl:  .  Norethindrone Acetate-Ethinyl Estradiol (JUNEL 1.5/30) 1.5-30 MG-MCG tablet, Take 1 tablet by mouth daily. as directed, Disp: 63 tablet, Rfl: 2 .  nitrofurantoin, macrocrystal-monohydrate, (MACROBID) 100 MG capsule, Take 1 capsule (100 mg total) by mouth 2 (two) times daily., Disp: 10 capsule, Rfl: 0   Review of Systems:   ROS  Negative unless otherwise specified per HPI.   Vitals:   Vitals:   11/30/19 1409  BP: 120/80  Pulse: 86  Temp: 98.4 F (36.9 C)  TempSrc: Temporal  SpO2: 99%  Weight: 142 lb 8 oz (64.6 kg)  Height: 5' 6.25" (1.683 m)     Body mass index is 22.83 kg/m.  Physical Exam:    Physical Exam Vitals and nursing note reviewed.  Constitutional:      General: She is not in acute distress.    Appearance: Normal appearance. She is well-developed. She is not ill-appearing or toxic-appearing.  Cardiovascular:     Rate and Rhythm: Normal rate and regular rhythm.     Pulses: Normal pulses.     Heart sounds: Normal heart sounds, S1 normal and S2 normal.  Pulmonary:     Effort: Pulmonary effort is normal.     Breath sounds: Normal breath sounds.  Abdominal:     General: Bowel sounds are normal.     Tenderness: There is no abdominal tenderness.  Skin:    General: Skin is warm and dry.  Neurological:     Mental Status: She is alert.     GCS: GCS eye subscore is 4. GCS verbal subscore is 5. GCS motor subscore is 6.  Psychiatric:        Speech: Speech normal.        Behavior: Behavior normal. Behavior is cooperative.     Results for orders placed or performed in visit on 11/30/19  POCT urinalysis dipstick  Result Value Ref Range   Color, UA light yellow    Clarity, UA cloudy    Glucose, UA Negative Negative   Bilirubin, UA Negative    Ketones, UA Negative    Spec Grav, UA <=1.005 (A) 1.010 - 1.025   Blood, UA positive    pH, UA 7.5 5.0 - 8.0   Protein, UA Negative Negative   Urobilinogen, UA 0.2 0.2 or 1.0 E.U./dL   Nitrite, UA Negative    Leukocytes, UA Negative Negative   Appearance     Odor      Assessment and Plan:   Angelica Li was seen today for urinary frequency and pelvic pressure.  Diagnoses and all orders for this visit:  Frequency of urination -     Urine Culture -     POCT urinalysis dipstick  Other orders -     nitrofurantoin, macrocrystal-monohydrate, (MACROBID) 100 MG capsule; Take 1 capsule (100 mg total) by mouth 2 (two) times daily.   UA negative.  Blood likely due to menses.  Possibly partially treated UTI?  Will empirically start alternative antibiotic, Macrobid, and await urine culture results.  She is in no apparent distress  today.  Follow-up based on clinical symptoms.  Worsening precautions advised.  . Reviewed expectations re: course of current medical issues. . Discussed self-management of symptoms. . Outlined signs and symptoms indicating need  for more acute intervention. . Patient verbalized understanding and all questions were answered. . See orders for this visit as documented in the electronic medical record. . Patient received an After-Visit Summary.  CMA or LPN served as scribe during this visit. History, Physical, and Plan performed by medical provider. The above documentation has been reviewed and is accurate and complete.   Inda Coke, PA-C

## 2019-11-30 NOTE — Patient Instructions (Signed)
It was great to see you!  Start oral macrobid per orders.  General instructions  Make sure you: ? Pee until your bladder is empty. ? Do not hold pee for a long time. ? Empty your bladder after sex. ? Wipe from front to back after pooping if you are a female. Use each tissue one time when you wipe.  Drink enough fluid to keep your pee pale yellow.  Keep all follow-up visits as told by your doctor. This is important. Contact a doctor if:  You do not get better after 1-2 days.  Your symptoms go away and then come back. Get help right away if:  You have very bad back pain.  You have very bad pain in your lower belly.  You have a fever.  You are sick to your stomach (nauseous).  You are throwing up.  Take care,  Inda Coke PA-C

## 2019-12-01 LAB — URINE CULTURE
MICRO NUMBER:: 10282438
Result:: NO GROWTH
SPECIMEN QUALITY:: ADEQUATE

## 2020-01-01 ENCOUNTER — Ambulatory Visit: Payer: 59 | Attending: Internal Medicine

## 2020-01-01 DIAGNOSIS — Z23 Encounter for immunization: Secondary | ICD-10-CM

## 2020-01-01 NOTE — Progress Notes (Signed)
   Covid-19 Vaccination Clinic  Name:  Angelica Li    MRN: [id number] DOB: [birth year]  01/01/2020  Angelica Li was observed post Covid-19 immunization for 15 minutes without incident. She was provided with Vaccine Information Sheet and instruction to access the V-Safe system.   Angelica Li was instructed to call 911 with any severe reactions post vaccine: Marland Kitchen Difficulty breathing  . Swelling of face and throat  . A fast heartbeat  . A bad rash all over body  . Dizziness and weakness   Immunizations Administered    Name Date Dose VIS Date Route   Pfizer COVID-19 Vaccine 01/01/2020  9:01 AM 0.3 mL 11/03/2018 Intramuscular   Manufacturer: [place]   Lot: [id number]   [place]: [id number]

## 2020-01-24 ENCOUNTER — Ambulatory Visit: Payer: 59 | Attending: Internal Medicine

## 2020-01-24 DIAGNOSIS — Z23 Encounter for immunization: Secondary | ICD-10-CM

## 2020-01-24 NOTE — Progress Notes (Signed)
   Covid-19 Vaccination Clinic  Name:  SHERMANE RAUSER    MRN: [id number] DOB: 2001-07-31  01/24/2020  Ms. Cederquist was observed post Covid-19 immunization for 15 minutes without incident. She was provided with Vaccine Information Sheet and instruction to access the V-Safe system.   Ms. Busch was instructed to call 911 with any severe reactions post vaccine: Marland Kitchen Difficulty breathing  . Swelling of face and throat  . A fast heartbeat  . A bad rash all over body  . Dizziness and weakness   Immunizations Administered    Name Date Dose VIS Date Route   Pfizer COVID-19 Vaccine 01/24/2020  8:49 AM 0.3 mL 11/03/2018 Intramuscular   Manufacturer: [place]   Lot: [id number]   [place]: [id number]

## 2020-02-22 ENCOUNTER — Encounter: Payer: Self-pay | Admitting: Adult Health

## 2020-02-22 ENCOUNTER — Other Ambulatory Visit: Payer: Self-pay

## 2020-02-22 ENCOUNTER — Ambulatory Visit (INDEPENDENT_AMBULATORY_CARE_PROVIDER_SITE_OTHER): Payer: 59 | Admitting: Adult Health

## 2020-02-22 DIAGNOSIS — F331 Major depressive disorder, recurrent, moderate: Secondary | ICD-10-CM

## 2020-02-22 DIAGNOSIS — F411 Generalized anxiety disorder: Secondary | ICD-10-CM | POA: Diagnosis not present

## 2020-02-22 NOTE — Progress Notes (Signed)
Angelica Li [id number] Mar 21, 2001 19 y.o.  Subjective:   Patient ID:  Angelica Li is a 19 y.o. (DOB 03-Feb-2001) female.  Chief Complaint: No chief complaint on file.   HPI Angelica Li presents to the office today for follow-up of GAD and MDD.  Describes mood today as "ok". Pleasant. Mood symptoms - denies depression and anxiety. More irritable lately. Feels a little "overstimulated" lately. Thinking about changes but trying not to go into a "tail spin" .Stating "I'm doing alright". Not too worried about things. Accepted to [employer] and plans to attend in the fall. Parents supportive. Stable interest and motivation. Taking medications as prescribed.  Energy levels stable. Active, has a regular exercise routine. Yoga. Horse riding. Walking. Enjoys some usual interests and activities. Single. Lives at home with parents and brother 43 - 2 dogs and 1 cat. Spending time with family. Appetite adequate. Weight stable 140 pounds. Sleeps well most nights. Averages 7 to 8 hours. Having a harder time getting to sleep - not worried - more restless. Focus and concentration stable. Completing tasks. Managing aspects of household. Student.  Denies SI or HI. Denies AH or VH. [name] - therapist for 2 and 1/2 years.    GAD-7     Office Visit from 06/23/2019 in [place] Visit from 05/12/2019 in [place] Visit from 12/21/2018 in [place]  Total GAD-7 Score 1 2 2     PHQ2-9     Office Visit from 12/21/2018 in [place] Visit from 11/20/2017 in [place]  PHQ-2 Total Score 0 0  PHQ-9 Total Score -- 0       Review of Systems:  Review of Systems  Musculoskeletal: Negative for gait problem.  Neurological: Negative for tremors.  Psychiatric/Behavioral:       Please refer to HPI    Medications: I have reviewed the  patient's current medications.  Current Outpatient Medications  Medication Sig Dispense Refill  . albuterol (VENTOLIN HFA) 108 (90 Base) MCG/ACT inhaler     . buPROPion (WELLBUTRIN XL) 150 MG 24 hr tablet Take 1 tablet (150 mg total) by mouth daily. 90 tablet 3  . cetirizine (ZYRTEC) 10 MG tablet Take 1 tablet (10 mg total) by mouth daily as needed for allergies. 30 tablet 0  . MULTIPLE VITAMIN PO Take 1 capsule by mouth daily.    . nitrofurantoin, macrocrystal-monohydrate, (MACROBID) 100 MG capsule Take 1 capsule (100 mg total) by mouth 2 (two) times daily. 10 capsule 0  . Norethindrone Acetate-Ethinyl Estradiol (JUNEL 1.5/30) 1.5-30 MG-MCG tablet Take 1 tablet by mouth daily. as directed 63 tablet 2   No current facility-administered medications for this visit.    Medication Side Effects: None  Allergies: No Known Allergies  Past Medical History:  Diagnosis Date  . Anxiety   . Depression   . Dysmenorrhea 11/20/2017    Family History  Problem Relation Age of Onset  . Inflammatory bowel disease Maternal Aunt   . Anxiety disorder Maternal Aunt   . Depression Maternal Aunt   . Migraines Maternal Aunt   . Ulcers Maternal Grandmother   . Anxiety disorder Maternal Grandmother   . Depression Maternal Grandmother   . Depression Mother   . ADD / ADHD Cousin     Social History   Socioeconomic History  . Marital status: Single    Spouse name: Not on file  . Number of children: Not  on file  . Years of education: Not on file  . Highest education level: Not on file  Occupational History  . 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 Smoking status: Never Smoker  . Smokeless tobacco: Never Used  Vaping Use  . Vaping Use: Never used  Substance and Sexual Activity  . Alcohol use: No  . Drug use: No  . Sexual activity: Never    Birth control/protection: Pill  Other Topics Concern  . Not on file  Social History Narrative  . Not on file   Social  Determinants of Health   Financial Resource Strain:   . Difficulty of Paying Living Expenses:   Food Insecurity:   . Worried About Charity fundraiser in the Last Year:   . Arboriculturist in the Last Year:   Transportation Needs:   . Film/video editor (Medical):   Marland Kitchen Lack of Transportation (Non-Medical):   Physical Activity:   . Days of Exercise per Week:   . Minutes of Exercise per Session:   Stress:   . Feeling of Stress :   Social Connections:   . Frequency of Communication with Friends and Family:   . Frequency of Social Gatherings with Friends and Family:   . Attends Religious Services:   . Active Member of Clubs or Organizations:   . Attends Archivist Meetings:   Marland Kitchen Marital Status:   Intimate Partner Violence:   . Fear of Current or Ex-Partner:   . Emotionally Abused:   Marland Kitchen Physically Abused:   . Sexually Abused:     Past Medical History, Surgical history, Social history, and Family history were reviewed and updated as appropriate.   Please see review of systems for further details on the patient's review from today.   Objective:   Physical Exam:  There were no vitals taken for this visit.  Physical Exam Constitutional:      General: She is not in acute distress. Musculoskeletal:        General: No deformity.  Neurological:     Mental Status: She is alert and oriented to person, place, and time.     Coordination: Coordination normal.  Psychiatric:        Attention and Perception: Attention and perception normal. She does not perceive auditory or visual hallucinations.        Mood and Affect: Mood normal. Mood is not anxious or depressed. Affect is not labile, blunt, angry or inappropriate.        Speech: Speech normal.        Behavior: Behavior normal.        Thought Content: Thought content normal. Thought content is not paranoid or delusional. Thought content does not include homicidal or suicidal ideation. Thought content does not include  homicidal or suicidal plan.        Cognition and Memory: Cognition and memory normal.        Judgment: Judgment normal.     Comments: Insight intact     Lab Review:     Component Value Date/Time   NA 139 11/27/2015 1926   K 3.4 (L) 11/27/2015 1926   CL 101 11/27/2015 1926   GLUCOSE 75 11/27/2015 1926   BUN 13 11/27/2015 1926   CREATININE 0.60 11/27/2015 1926   PROT 6.8 02/28/2011 1128   ALBUMIN 4.5 02/28/2011 1128   AST 19 02/28/2011 1128   ALT 9 02/28/2011 1128   ALKPHOS 168 02/28/2011 1128   BILITOT  0.4 02/28/2011 1128       Component Value Date/Time   WBC 8.6 11/27/2015 1900   RBC 4.23 11/27/2015 1900   HGB 13.3 11/27/2015 1926   HCT 39.0 11/27/2015 1926   PLT 159 11/27/2015 1900   MCV 91.0 11/27/2015 1900   MCH 31.4 11/27/2015 1900   MCHC 34.5 11/27/2015 1900   RDW 12.4 11/27/2015 1900   LYMPHSABS 0.9 (L) 11/27/2015 1900   MONOABS 0.6 11/27/2015 1900   EOSABS 0.0 11/27/2015 1900   BASOSABS 0.0 11/27/2015 1900    No results found for: POCLITH, LITHIUM   No results found for: PHENYTOIN, PHENOBARB, VALPROATE, CBMZ   .res Assessment: Plan:    Plan:  1. Wellbutrin XL 150mg  daily  Continue therapy   RTC 6 months  Patient advised to contact office with any questions, adverse effects, or acute worsening in signs and symptoms. Diagnoses and all orders for this visit:   There are no diagnoses linked to this encounter.   Please see After Visit Summary for patient specific instructions.  No future appointments.  No orders of the defined types were placed in this encounter.   -------------------------------

## 2020-03-14 ENCOUNTER — Encounter: Payer: Self-pay | Admitting: Family Medicine

## 2020-04-19 ENCOUNTER — Encounter: Payer: Self-pay | Admitting: Family Medicine

## 2020-08-24 ENCOUNTER — Other Ambulatory Visit: Payer: Self-pay

## 2020-08-24 ENCOUNTER — Encounter: Payer: Self-pay | Admitting: Family Medicine

## 2020-08-24 ENCOUNTER — Ambulatory Visit (INDEPENDENT_AMBULATORY_CARE_PROVIDER_SITE_OTHER): Payer: 59 | Admitting: Family Medicine

## 2020-08-24 VITALS — BP 100/60 | HR 92 | Temp 98.3°F | Ht 66.0 in | Wt 145.8 lb

## 2020-08-24 DIAGNOSIS — Z0000 Encounter for general adult medical examination without abnormal findings: Secondary | ICD-10-CM

## 2020-08-24 DIAGNOSIS — J301 Allergic rhinitis due to pollen: Secondary | ICD-10-CM | POA: Insufficient documentation

## 2020-08-24 DIAGNOSIS — N946 Dysmenorrhea, unspecified: Secondary | ICD-10-CM | POA: Diagnosis not present

## 2020-08-24 DIAGNOSIS — Z3041 Encounter for surveillance of contraceptive pills: Secondary | ICD-10-CM | POA: Diagnosis not present

## 2020-08-24 DIAGNOSIS — F418 Other specified anxiety disorders: Secondary | ICD-10-CM | POA: Diagnosis not present

## 2020-08-24 NOTE — Patient Instructions (Addendum)
Please return in 12 months for your annual complete physical; please come fasting.  I'm glad you are feeling well! Happy Holidays.   If you have any questions or concerns, please don't hesitate to send me a message via MyChart or call the office at [phone]. Thank you for visiting with [place] today! It's our pleasure caring for you.

## 2020-08-24 NOTE — Progress Notes (Signed)
Subjective  Chief Complaint  Patient presents with  . Annual Exam    fasting    HPI: Angelica Li is a 19 y.o. female who presents to [facility] at [facility] today for a Female Wellness Visit.   Wellness Visit: annual visit with health maintenance review and exam without Pap   19 year old with chronic anxiety now seen psychiatry and managed well on Wellbutrin.  Here for complete physical.  She sees GYN next week.  She is on a triphasic oral contraceptive to manage her dysmenorrhea.  She is having breakthrough bleeding so we will switch to triphasic, however she feels that her PMS symptoms and moodiness and worry are worse on this formulation.  Her dysmenorrhea is well controlled.  She has never been sexually active.  She is transferred to [employer] and studying psychology.  She likes it very much.  She is home on break.  Social life is good.  Family life is good.  She physically feels well.  Healthy lifestyle.  Assessment  1. Annual physical exam   2. Mixed anxiety and depressive disorder   3. Oral contraceptive use   4. Dysmenorrhea   5. Seasonal allergic rhinitis due to pollen      Plan  Female Wellness Visit:  Age appropriate Health Maintenance and Prevention measures were discussed with patient. Included topics are cancer screening recommendations, ways to keep healthy (see AVS) including dietary and exercise recommendations, regular eye and dental care, use of seat belts, and avoidance of moderate alcohol use and tobacco use.  No STD screening indicated.  BMI: discussed patient's BMI and encouraged positive lifestyle modifications to help get to or maintain a target BMI.  HM needs and immunizations were addressed and ordered. See below for orders. See HM and immunization section for updates.  All immunizations are up-to-date.  Routine labs and screening tests ordered including cmp, cbc and lipids where appropriate.  Discussed recommendations  regarding Vit D and calcium supplementation (see AVS)  Anxiety and allergies are well controlled.  Mood is well controlled.  Discussed consideration of continuous oral contraceptive use cycling every 3 months to limit PMS symptoms.  She will discuss with GYN.  Follow up: Return in about 1 year (around 08/24/2021) for complete physical.   No orders of the defined types were placed in this encounter.  No orders of the defined types were placed in this encounter.     Lifestyle: Body mass index is 23.53 kg/m. Wt Readings from Last 3 Encounters:  08/24/20 145 lb 12.8 oz (66.1 kg)  11/30/19 142 lb 8 oz (64.6 kg)  05/12/19 141 lb (64 kg) (71 %, Z= 0.56)*   * Growth percentiles are based on CDC (Girls, 2-20 Years) data.     Patient Active Problem List   Diagnosis Date Noted  . Seasonal allergic rhinitis due to pollen 08/24/2020  . Oral contraceptive use 11/20/2017  . Dysmenorrhea 11/20/2017  . Mixed anxiety and depressive disorder 08/17/2015  . Keratosis pilaris 02/20/2012   Health Maintenance  Topic Date Due  . Hepatitis C Screening  08/24/2021 (Originally 07/04/2000)  . HIV Screening  08/24/2021 (Originally 10/10/2014)  . TETANUS/TDAP  10/17/2020  . INFLUENZA VACCINE  Completed  . COVID-19 Vaccine  Completed   Immunization History  Administered Date(s) Administered  . DTaP 11/23/1999, 01/24/2000, 04/18/2000, 01/07/2001, 10/14/2003  . DTaP / Hep B / IPV 11/23/1999, 01/24/2000, 07/09/2000, 10/14/2003  . HPV Quadrivalent 10/17/2010, 12/18/2010, 10/28/2011  . Hepatitis A 10/14/2005, 10/17/2006  .  Hepatitis B 07-Apr-2000, 11/23/1999, 07/09/2000  . HiB (PRP-OMP) 11/23/1999, 01/24/2000, 04/18/2000, 01/07/2001  . Hpv 10/17/2010, 12/18/2010  . Influenza,inj,Quad PF,6+ Mos 06/16/2018, 05/12/2019, 06/09/2020  . Influenza-Unspecified 06/24/2017  . MMR 10/15/2000, 10/14/2003  . Meningococcal B, OMV 12/16/2016, 01/09/2018  . Meningococcal Polysaccharide 10/17/2010, 12/16/2016  . PFIZER  SARS-COV-2 Vaccination 01/01/2020, 01/24/2020  . Pneumococcal Conjugate-13 10/17/2006  . Td 10/17/2010  . Tdap 10/17/2010  . Varicella 10/15/2000, 10/14/2005   We updated and reviewed the patient's past history in detail and it is documented below. Allergies: Patient has No Known Allergies. Past Medical History Patient  has a past medical history of Allergy, Anxiety, Depression, and Dysmenorrhea (11/20/2017). Past Surgical History Patient  has a past surgical history that includes tubes in ears and Mole removal. Family History: Patient family history includes ADD / ADHD in her cousin; Anxiety disorder in her maternal aunt and maternal grandmother; Depression in her maternal aunt, maternal grandmother, and mother; Inflammatory bowel disease in her maternal aunt; Migraines in her maternal aunt; Ulcers in her maternal grandmother. Social History:  Patient  reports that she has never smoked. She has never used smokeless tobacco. She reports that she does not drink alcohol and does not use drugs.  Review of Systems: Constitutional: negative for fever or malaise Ophthalmic: negative for photophobia, double vision or loss of vision Cardiovascular: negative for chest pain, dyspnea on exertion, or new LE swelling Respiratory: negative for SOB or persistent cough Gastrointestinal: negative for abdominal pain, change in bowel habits or melena Genitourinary: negative for dysuria or gross hematuria, no abnormal uterine bleeding or disharge Musculoskeletal: negative for new gait disturbance or muscular weakness Integumentary: negative for new or persistent rashes, no breast lumps Neurological: negative for TIA or stroke symptoms Psychiatric: negative for SI or delusions Allergic/Immunologic: negative for hives Patient Care Team    Relationship Specialty Notifications Start End  Leamon Arnt, MD PCP - General Family Medicine  11/20/17   Linda Hedges, DO Consulting Physician Obstetrics and  Gynecology  11/20/17     Objective  Vitals: BP 100/60   Pulse 92   Temp 98.3 F (36.8 C) (Temporal)   Ht _0  (1.676 m)   Wt 145 lb 12.8 oz (66.1 kg)   LMP 08/03/2020 (Approximate)   SpO2 98%   BMI 23.53 kg/m  General:  Well developed, well nourished, no acute distress  Psych:  Alert and orientedx3,normal mood and affect HEENT:  Normocephalic, atraumatic, non-icteric sclera, supple neck without adenopathy, mass or thyromegaly Cardiovascular:  Normal S1, S2, RRR without gallop, rub or murmur Respiratory:  Good breath sounds bilaterally, CTAB with normal respiratory effort Gastrointestinal: normal bowel sounds, soft, non-tender, no noted masses. No HSM MSK: no deformities, contusions. Joints are without erythema or swelling. Spine and CVA region are nontender Skin:  Warm, no rashes or suspicious lesions noted Neurologic:    Mental status is normal. Gross motor and sensory exams are normal. Normal gait. No tremor    Commons side effects, risks, benefits, and alternatives for medications and treatment plan prescribed today were discussed, and the patient expressed understanding of the given instructions. Patient is instructed to call or message via MyChart if he/she has any questions or concerns regarding our treatment plan. No barriers to understanding were identified. We discussed Red Flag symptoms and signs in detail. Patient expressed understanding regarding what to do in case of urgent or emergency type symptoms.   Medication list was reconciled, printed and provided to the patient in AVS. Patient instructions and summary  information was reviewed with the patient as documented in the AVS. This note was prepared with assistance of Dragon voice recognition software. Occasional wrong-word or sound-a-like substitutions may have occurred due to the inherent limitations of voice recognition software  This visit occurred during the SARS-CoV-2 public health emergency.  Safety protocols were  in place, including screening questions prior to the visit, additional usage of staff PPE, and extensive cleaning of exam room while observing appropriate contact time as indicated for disinfecting solutions.

## 2020-09-04 ENCOUNTER — Other Ambulatory Visit (HOSPITAL_COMMUNITY): Payer: Self-pay

## 2020-09-05 ENCOUNTER — Other Ambulatory Visit (HOSPITAL_COMMUNITY): Payer: Self-pay | Admitting: Obstetrics & Gynecology

## 2020-09-11 ENCOUNTER — Other Ambulatory Visit (HOSPITAL_COMMUNITY): Payer: Self-pay | Admitting: Obstetrics & Gynecology

## 2020-11-29 ENCOUNTER — Other Ambulatory Visit (HOSPITAL_COMMUNITY): Payer: Self-pay | Admitting: Obstetrics & Gynecology

## 2020-12-23 MED FILL — Bupropion HCl Tab ER 24HR 150 MG: ORAL | 30 days supply | Qty: 30 | Fill #0 | Status: AC

## 2020-12-25 ENCOUNTER — Other Ambulatory Visit (HOSPITAL_COMMUNITY): Payer: Self-pay

## 2021-01-19 ENCOUNTER — Encounter: Payer: Self-pay | Admitting: Family Medicine

## 2021-01-23 ENCOUNTER — Other Ambulatory Visit: Payer: Self-pay

## 2021-01-23 ENCOUNTER — Ambulatory Visit (INDEPENDENT_AMBULATORY_CARE_PROVIDER_SITE_OTHER): Payer: 59

## 2021-01-23 DIAGNOSIS — Z111 Encounter for screening for respiratory tuberculosis: Secondary | ICD-10-CM | POA: Diagnosis not present

## 2021-01-23 NOTE — Progress Notes (Signed)
Per orders of Dr. Jonni Sanger , injection of Tuberculin given by Tobe Sos in right forearm. Patient tolerated injection well. Patient will make appointment for 2 days to have read.

## 2021-01-30 NOTE — Telephone Encounter (Signed)
Pt called following up on this. Please advise.

## 2021-01-31 ENCOUNTER — Ambulatory Visit (INDEPENDENT_AMBULATORY_CARE_PROVIDER_SITE_OTHER): Payer: 59 | Admitting: *Deleted

## 2021-01-31 ENCOUNTER — Other Ambulatory Visit: Payer: Self-pay

## 2021-01-31 DIAGNOSIS — Z111 Encounter for screening for respiratory tuberculosis: Secondary | ICD-10-CM

## 2021-01-31 NOTE — Progress Notes (Signed)
Patient present for PPD testing. PPD one on right forearm. Patient tolerated injection well.  Advise to returned between 76 to 72 hours for reading  Pt verbalized understanding  Will schedule appt for Friday

## 2021-02-02 ENCOUNTER — Ambulatory Visit: Payer: 59

## 2021-02-02 ENCOUNTER — Other Ambulatory Visit: Payer: Self-pay

## 2021-02-02 DIAGNOSIS — Z111 Encounter for screening for respiratory tuberculosis: Secondary | ICD-10-CM

## 2021-02-02 NOTE — Progress Notes (Signed)
Patient presented in office for TB Reading on Left arm. Reading was negative.

## 2021-02-06 ENCOUNTER — Other Ambulatory Visit (HOSPITAL_COMMUNITY): Payer: Self-pay

## 2021-02-06 MED FILL — Bupropion HCl Tab ER 24HR 150 MG: ORAL | 30 days supply | Qty: 30 | Fill #1 | Status: AC

## 2021-02-19 ENCOUNTER — Other Ambulatory Visit (HOSPITAL_COMMUNITY): Payer: Self-pay

## 2021-02-27 ENCOUNTER — Other Ambulatory Visit (HOSPITAL_COMMUNITY): Payer: Self-pay

## 2021-02-27 MED FILL — Bupropion HCl Tab ER 24HR 150 MG: ORAL | 30 days supply | Qty: 30 | Fill #2 | Status: AC

## 2021-03-01 ENCOUNTER — Other Ambulatory Visit (HOSPITAL_COMMUNITY): Payer: Self-pay

## 2021-03-19 ENCOUNTER — Other Ambulatory Visit (HOSPITAL_COMMUNITY): Payer: Self-pay

## 2021-03-19 MED FILL — Drospirenone-Ethinyl Estradiol Tab 3-0.02 MG: ORAL | 28 days supply | Qty: 28 | Fill #0 | Status: AC

## 2021-04-19 ENCOUNTER — Other Ambulatory Visit (HOSPITAL_COMMUNITY): Payer: Self-pay

## 2021-04-19 MED FILL — Drospirenone-Ethinyl Estradiol Tab 3-0.02 MG: ORAL | 28 days supply | Qty: 28 | Fill #1 | Status: CN

## 2021-04-19 MED FILL — Drospirenone-Ethinyl Estradiol Tab 3-0.02 MG: ORAL | 28 days supply | Qty: 28 | Fill #0 | Status: CN

## 2021-04-19 MED FILL — Drospirenone-Ethinyl Estradiol Tab 3-0.02 MG: ORAL | 28 days supply | Qty: 28 | Fill #0 | Status: AC

## 2021-06-26 ENCOUNTER — Other Ambulatory Visit (HOSPITAL_COMMUNITY): Payer: Self-pay

## 2021-06-26 MED ORDER — DROSPIRENONE-ETHINYL ESTRADIOL 3-0.02 MG PO TABS
1.0000 | ORAL_TABLET | Freq: Every day | ORAL | 2 refills | Status: AC
  Filled 2021-06-26: qty 28, 28d supply, fill #0
  Filled 2021-07-09: qty 84, 84d supply, fill #0

## 2021-07-04 ENCOUNTER — Other Ambulatory Visit (HOSPITAL_COMMUNITY): Payer: Self-pay

## 2021-07-09 ENCOUNTER — Other Ambulatory Visit (HOSPITAL_COMMUNITY): Payer: Self-pay

## 2021-07-10 ENCOUNTER — Other Ambulatory Visit (HOSPITAL_COMMUNITY): Payer: Self-pay

## 2021-07-18 ENCOUNTER — Other Ambulatory Visit (HOSPITAL_COMMUNITY): Payer: Self-pay

## 2021-10-16 ENCOUNTER — Other Ambulatory Visit (HOSPITAL_COMMUNITY): Payer: Self-pay

## 2021-10-16 MED ORDER — DROSPIRENONE-ETHINYL ESTRADIOL 3-0.02 MG PO TABS
1.0000 | ORAL_TABLET | Freq: Every day | ORAL | 0 refills | Status: AC
  Filled 2021-10-16: qty 28, 28d supply, fill #0
  Filled 2021-10-17: qty 84, 84d supply, fill #0

## 2021-10-17 ENCOUNTER — Other Ambulatory Visit (HOSPITAL_COMMUNITY): Payer: Self-pay

## 2022-01-08 ENCOUNTER — Other Ambulatory Visit (HOSPITAL_COMMUNITY): Payer: Self-pay

## 2022-01-08 MED ORDER — DROSPIRENONE-ETHINYL ESTRADIOL 3-0.02 MG PO TABS
1.0000 | ORAL_TABLET | Freq: Every day | ORAL | 0 refills | Status: AC
  Filled 2022-01-08 – 2022-01-18 (×2): qty 28, 28d supply, fill #0

## 2022-01-17 ENCOUNTER — Other Ambulatory Visit (HOSPITAL_COMMUNITY): Payer: Self-pay

## 2022-01-18 ENCOUNTER — Other Ambulatory Visit (HOSPITAL_COMMUNITY): Payer: Self-pay

## 2022-01-25 ENCOUNTER — Other Ambulatory Visit (HOSPITAL_COMMUNITY): Payer: Self-pay

## 2022-01-25 MED ORDER — YASMIN 28 3-0.03 MG PO TABS
1.0000 | ORAL_TABLET | Freq: Every day | ORAL | 3 refills | Status: DC
  Filled 2022-01-25: qty 28, 28d supply, fill #0
  Filled 2022-02-21: qty 28, 28d supply, fill #1
  Filled 2022-03-17: qty 28, 28d supply, fill #2
  Filled 2022-04-15: qty 28, 28d supply, fill #3
  Filled 2022-05-15: qty 28, 28d supply, fill #4
  Filled 2022-06-03: qty 28, 28d supply, fill #5
  Filled 2022-07-02: qty 28, 28d supply, fill #6
  Filled 2022-07-31: qty 28, 28d supply, fill #7
  Filled 2022-08-25: qty 28, 28d supply, fill #8
  Filled 2022-09-23: qty 28, 28d supply, fill #9
  Filled 2022-10-20: qty 28, 28d supply, fill #10
  Filled 2022-11-21: qty 28, 28d supply, fill #11

## 2022-01-25 MED ORDER — BUPROPION HCL ER (XL) 150 MG PO TB24
150.0000 mg | ORAL_TABLET | Freq: Every day | ORAL | 3 refills | Status: AC
  Filled 2022-01-25: qty 30, 30d supply, fill #0

## 2022-01-28 ENCOUNTER — Other Ambulatory Visit (HOSPITAL_COMMUNITY): Payer: Self-pay

## 2022-02-21 ENCOUNTER — Other Ambulatory Visit (HOSPITAL_COMMUNITY): Payer: Self-pay

## 2022-03-18 ENCOUNTER — Other Ambulatory Visit (HOSPITAL_COMMUNITY): Payer: Self-pay

## 2022-04-15 ENCOUNTER — Other Ambulatory Visit (HOSPITAL_COMMUNITY): Payer: Self-pay

## 2022-04-16 ENCOUNTER — Other Ambulatory Visit (HOSPITAL_COMMUNITY): Payer: Self-pay

## 2022-05-15 ENCOUNTER — Other Ambulatory Visit (HOSPITAL_COMMUNITY): Payer: Self-pay

## 2022-06-03 ENCOUNTER — Encounter: Payer: Self-pay | Admitting: *Deleted

## 2022-06-03 ENCOUNTER — Other Ambulatory Visit (HOSPITAL_COMMUNITY): Payer: Self-pay

## 2022-06-05 ENCOUNTER — Other Ambulatory Visit (HOSPITAL_COMMUNITY): Payer: Self-pay

## 2022-07-03 ENCOUNTER — Other Ambulatory Visit (HOSPITAL_COMMUNITY): Payer: Self-pay

## 2022-07-04 ENCOUNTER — Other Ambulatory Visit (HOSPITAL_COMMUNITY): Payer: Self-pay

## 2022-07-31 ENCOUNTER — Other Ambulatory Visit (HOSPITAL_COMMUNITY): Payer: Self-pay

## 2022-08-22 ENCOUNTER — Encounter: Payer: Self-pay | Admitting: *Deleted

## 2022-08-26 ENCOUNTER — Other Ambulatory Visit (HOSPITAL_COMMUNITY): Payer: Self-pay

## 2022-08-26 ENCOUNTER — Other Ambulatory Visit: Payer: Self-pay

## 2022-09-23 ENCOUNTER — Other Ambulatory Visit (HOSPITAL_COMMUNITY): Payer: Self-pay

## 2022-09-23 ENCOUNTER — Other Ambulatory Visit: Payer: Self-pay

## 2022-12-18 ENCOUNTER — Other Ambulatory Visit (HOSPITAL_COMMUNITY): Payer: Self-pay

## 2022-12-18 MED ORDER — YASMIN 28 3-0.03 MG PO TABS
1.0000 | ORAL_TABLET | Freq: Every day | ORAL | 0 refills | Status: DC
  Filled 2022-12-18: qty 28, 28d supply, fill #0
  Filled 2023-01-15: qty 28, 28d supply, fill #1
  Filled 2023-02-15: qty 28, 28d supply, fill #2

## 2022-12-19 ENCOUNTER — Other Ambulatory Visit (HOSPITAL_COMMUNITY): Payer: Self-pay

## 2023-01-15 ENCOUNTER — Other Ambulatory Visit (HOSPITAL_COMMUNITY): Payer: Self-pay

## 2023-01-17 ENCOUNTER — Other Ambulatory Visit (HOSPITAL_COMMUNITY): Payer: Self-pay

## 2023-02-17 ENCOUNTER — Other Ambulatory Visit: Payer: Self-pay

## 2023-02-17 ENCOUNTER — Other Ambulatory Visit (HOSPITAL_COMMUNITY): Payer: Self-pay

## 2023-02-25 ENCOUNTER — Other Ambulatory Visit (HOSPITAL_COMMUNITY): Payer: Self-pay

## 2023-02-25 MED ORDER — YASMIN 28 3-0.03 MG PO TABS
1.0000 | ORAL_TABLET | Freq: Every day | ORAL | 3 refills | Status: AC
  Filled 2023-02-25 – 2023-03-16 (×2): qty 28, 28d supply, fill #0
  Filled 2023-04-14: qty 28, 28d supply, fill #1

## 2023-03-17 ENCOUNTER — Other Ambulatory Visit (HOSPITAL_COMMUNITY): Payer: Self-pay

## 2023-03-17 ENCOUNTER — Other Ambulatory Visit: Payer: Self-pay

## 2023-04-29 ENCOUNTER — Other Ambulatory Visit (HOSPITAL_COMMUNITY): Payer: Self-pay

## 2023-04-29 MED ORDER — ALPRAZOLAM 0.5 MG PO TABS
0.5000 mg | ORAL_TABLET | Freq: Four times a day (QID) | ORAL | 0 refills | Status: DC | PRN
  Filled 2023-04-29: qty 30, 8d supply, fill #0

## 2024-01-29 ENCOUNTER — Encounter: Payer: Self-pay | Admitting: Family Medicine

## 2024-05-04 ENCOUNTER — Other Ambulatory Visit (HOSPITAL_COMMUNITY): Payer: Self-pay

## 2024-05-04 MED ORDER — ALPRAZOLAM 0.5 MG PO TABS
0.5000 mg | ORAL_TABLET | Freq: Four times a day (QID) | ORAL | 0 refills | Status: AC | PRN
  Filled 2024-05-04: qty 30, 8d supply, fill #0

## 2024-05-04 MED ORDER — DROSPIRENONE-ETHINYL ESTRADIOL 3-0.03 MG PO TABS
1.0000 | ORAL_TABLET | Freq: Every day | ORAL | 3 refills | Status: AC
  Filled 2024-05-04: qty 84, 84d supply, fill #0

## 2024-06-25 ENCOUNTER — Other Ambulatory Visit (HOSPITAL_COMMUNITY): Payer: Self-pay

## 2024-06-25 MED ORDER — BUSPIRONE HCL 10 MG PO TABS
ORAL_TABLET | ORAL | 0 refills | Status: AC
  Filled 2024-06-25: qty 60, 28d supply, fill #0

## 2024-08-31 ENCOUNTER — Ambulatory Visit: Admitting: Family

## 2024-08-31 ENCOUNTER — Encounter: Payer: Self-pay | Admitting: Family

## 2024-08-31 ENCOUNTER — Encounter: Admitting: Family Medicine

## 2024-08-31 VITALS — BP 120/82 | HR 69 | Temp 97.9°F | Ht 65.0 in | Wt 153.4 lb

## 2024-08-31 DIAGNOSIS — F411 Generalized anxiety disorder: Secondary | ICD-10-CM | POA: Insufficient documentation

## 2024-08-31 DIAGNOSIS — Z0000 Encounter for general adult medical examination without abnormal findings: Secondary | ICD-10-CM

## 2024-08-31 DIAGNOSIS — Z1159 Encounter for screening for other viral diseases: Secondary | ICD-10-CM | POA: Diagnosis not present

## 2024-08-31 DIAGNOSIS — Z114 Encounter for screening for human immunodeficiency virus [HIV]: Secondary | ICD-10-CM | POA: Diagnosis not present

## 2024-08-31 LAB — COMPREHENSIVE METABOLIC PANEL WITH GFR
ALT: 8 U/L (ref 3–35)
AST: 12 U/L (ref 5–37)
Albumin: 4.4 g/dL (ref 3.5–5.2)
Alkaline Phosphatase: 43 U/L (ref 39–117)
BUN: 9 mg/dL (ref 6–23)
CO2: 27 meq/L (ref 19–32)
Calcium: 9.4 mg/dL (ref 8.4–10.5)
Chloride: 103 meq/L (ref 96–112)
Creatinine, Ser: 0.7 mg/dL (ref 0.40–1.20)
GFR: 120.65 mL/min
Glucose, Bld: 81 mg/dL (ref 70–99)
Potassium: 4 meq/L (ref 3.5–5.1)
Sodium: 139 meq/L (ref 135–145)
Total Bilirubin: 0.6 mg/dL (ref 0.2–1.2)
Total Protein: 7 g/dL (ref 6.0–8.3)

## 2024-08-31 LAB — CBC WITH DIFFERENTIAL/PLATELET
Basophils Absolute: 0 K/uL (ref 0.0–0.1)
Basophils Relative: 0.3 % (ref 0.0–3.0)
Eosinophils Absolute: 0 K/uL (ref 0.0–0.7)
Eosinophils Relative: 0.6 % (ref 0.0–5.0)
HCT: 41.5 % (ref 36.0–46.0)
Hemoglobin: 14.7 g/dL (ref 12.0–15.0)
Lymphocytes Relative: 24 % (ref 12.0–46.0)
Lymphs Abs: 1.1 K/uL (ref 0.7–4.0)
MCHC: 35.3 g/dL (ref 30.0–36.0)
MCV: 89.8 fl (ref 78.0–100.0)
Monocytes Absolute: 0.3 K/uL (ref 0.1–1.0)
Monocytes Relative: 5.3 % (ref 3.0–12.0)
Neutro Abs: 3.3 K/uL (ref 1.4–7.7)
Neutrophils Relative %: 69.8 % (ref 43.0–77.0)
Platelets: 265 K/uL (ref 150.0–400.0)
RBC: 4.63 Mil/uL (ref 3.87–5.11)
RDW: 12.8 % (ref 11.5–15.5)
WBC: 4.7 K/uL (ref 4.0–10.5)

## 2024-08-31 LAB — LIPID PANEL
Cholesterol: 107 mg/dL (ref 28–200)
HDL: 57.8 mg/dL
LDL Cholesterol: 35 mg/dL (ref 10–99)
NonHDL: 49.69
Total CHOL/HDL Ratio: 2
Triglycerides: 73 mg/dL (ref 10.0–149.0)
VLDL: 14.6 mg/dL (ref 0.0–40.0)

## 2024-08-31 LAB — TSH: TSH: 1.52 u[IU]/mL (ref 0.35–5.50)

## 2024-08-31 NOTE — Patient Instructions (Addendum)
 Welcome to [facility] at [facility], It was a pleasure meeting you today!   I will review your lab results via MyChart in a few days.  You look great! Stay well! Keep exercising!  Have a wonderful holiday!   PLEASE NOTE: If you had any LAB tests please let us  know if you have not heard back within a few days. You may see your results on MyChart before we have a chance to review them but we will give you a call once they are reviewed by us . If we ordered any REFERRALS today, please let us  know if you have not heard from their office within the next week.  Let us  know through MyChart if you are needing REFILLS, or have your pharmacy send us  the request. You can also use MyChart to communicate with me or any office staff.  Please try these tips to maintain a healthy lifestyle: It is important that you exercise regularly at least 30 minutes 5 times a week. Think about what you will eat, plan ahead. Choose whole foods, & think  clean, green, fresh or frozen over canned, processed or packaged foods which are more sugary, salty, and fatty. 70 to 75% of food eaten should be fresh vegetables and protein. 2-3  meals daily with healthy snacks between meals, but must be whole fruit, protein or vegetables. Aim to eat over a 10 hour period when you are active, for example, 7am to 5pm, and then STOP after your last meal of the day, drinking only water.  Shorter eating windows, 6-8 hours, are showing benefits in heart disease and blood sugar regulation. Drink water every day! Shoot for 64 ounces daily = 8 cups, no other drink is as healthy! Fruit juice is best enjoyed in a healthy way, by EATING the fruit.

## 2024-08-31 NOTE — Progress Notes (Signed)
 " Phone [phone]  Subjective:   Patient is a 23 y.o. female presenting for annual physical.    Chief Complaint  Patient presents with   New Patient (Initial Visit)   Annual Exam    Fasting w/ labs  Discussed the use of AI scribe software for clinical note transcription with the patient, who gave verbal consent to proceed.  History of Present Illness Angelica Li is a 23 year old female who presents to establish care and for an annual physical exam.  She manages anxiety and depression with buspirone , Wellbutrin , and Xanax  as needed, and has noticed increased anxiety recently. She is a gaffer in occupational therapy, single, and drinks alcohol minimally on occasion. She does not smoke. Her menses are regular on Yaz, lasting about five days without heavy bleeding or cramping. She had a recent Pap smear that was normal. She is physically active, walking her dog twice daily for two to four miles and sometimes running. She reports daily soft bowel movements and adequate hydration, targeting about two to two and a half liters of water or caffeine-free fluids daily.  See problem oriented charting- ROS- full  review of systems was completed and negative except for what is noted in HPI above.  The following were reviewed and entered/updated in epic: Past Medical History:  Diagnosis Date   Allergy    Anxiety    Depression    Dysmenorrhea 11/20/2017   Laceration without foreign body of left ring finger with damage to nail, initial encounter 06/09/2016   Patient Active Problem List   Diagnosis Date Noted   Seasonal allergic rhinitis due to pollen 08/24/2020   Oral contraceptive use 11/20/2017   Dysmenorrhea 11/20/2017   Mixed anxiety and depressive disorder 08/17/2015   Keratosis pilaris 02/20/2012   Past Surgical History:  Procedure Laterality Date   MOLE REMOVAL     tubes in ears     twice when younger, none now    Family History  Problem Relation Age of Onset    Inflammatory bowel disease Maternal Aunt    Anxiety disorder Maternal Aunt    Depression Maternal Aunt    Migraines Maternal Aunt    Ulcers Maternal Grandmother    Anxiety disorder Maternal Grandmother    Depression Maternal Grandmother    Depression Mother    ADD / ADHD Cousin     Medications- reviewed and updated Current Outpatient Medications  Medication Sig Dispense Refill   ALPRAZolam  (XANAX ) 0.5 MG tablet Take 1 tablet (0.5 mg total) by mouth every 6 (six) hours as needed. 30 tablet 0   buPROPion  (WELLBUTRIN  XL) 150 MG 24 hr tablet Take 1 tablet (150 mg total) by mouth daily. 90 tablet 3   buPROPion  (WELLBUTRIN  XL) 150 MG 24 hr tablet TAKE 1 TABLET BY MOUTH EVERY DAY 90 tablet 3   buPROPion  (WELLBUTRIN  XL) 150 MG 24 hr tablet TAKE 1 TABLET BY MOUTH TWICE A DAY 60 tablet 11   buPROPion  (WELLBUTRIN  XL) 150 MG 24 hr tablet Take 1 tablet (150 mg total) by mouth daily. 90 tablet 3   busPIRone  (BUSPAR ) 10 MG tablet Take 10 mg by mouth 2 (two) times daily.     cetirizine  (ZYRTEC ) 10 MG tablet Take 1 tablet (10 mg total) by mouth daily as needed for allergies. 30 tablet 0   drospirenone -ethinyl estradiol  (YAZ) 3-0.02 MG tablet Take 1 tablet by mouth daily. 84 tablet 2   drospirenone -ethinyl estradiol  (YAZ) 3-0.02 MG tablet Take 1 tablet by mouth daily.  84 tablet 0   drospirenone -ethinyl estradiol  (YAZ) 3-0.02 MG tablet Take 1 tablet by mouth daily. 84 tablet 0   MULTIPLE VITAMIN PO Take 1 capsule by mouth daily.     drospirenone -ethinyl estradiol  (SYEDA ) 3-0.03 MG tablet TAKE 1 TABLET BY MOUTH DAILY 84 tablet 3   JASMIEL  3-0.02 MG tablet TAKE 1 TABLET BY MOUTH EVERY DAY 84 tablet 3   Norgestimate-Ethinyl Estradiol  Triphasic 0.18/0.215/0.25 MG-35 MCG tablet Take 1 tablet by mouth daily.     Norgestimate-Ethinyl Estradiol  Triphasic 0.18/0.215/0.25 MG-35 MCG tablet TAKE 1 TABLET BY MOUTH ONCE A DAY 28 tablet 0   YASMIN  28 3-0.03 MG tablet Take 1 tablet by mouth daily. 84 tablet 3   No  current facility-administered medications for this visit.    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 Equestrian as hobby    Objective:  BP 120/82 (BP Location: Left Arm, Patient Position: Sitting, Cuff Size: Normal)   Pulse 69   Temp 97.9 F (36.6 C) (Temporal)   Ht 5' 5 (1.651 m)   Wt 153 lb 6.4 oz (69.6 kg)   LMP 08/25/2024 (Exact Date)   SpO2 99%   BMI 25.53 kg/m  Physical Exam Vitals and nursing note reviewed.  Constitutional:      Appearance: Normal appearance.  HENT:     Head: Normocephalic.     Right Ear: Tympanic membrane normal.     Left Ear: Tympanic membrane normal.     Nose: Nose normal.     Mouth/Throat:     Mouth: Mucous membranes are moist.  Eyes:     Pupils: Pupils are equal, round, and reactive to light.  Cardiovascular:     Rate and Rhythm: Normal rate and regular rhythm.  Pulmonary:     Effort: Pulmonary effort is normal.     Breath sounds: Normal breath sounds.  Musculoskeletal:        General: Normal range of motion.     Cervical back: Normal range of motion.  Lymphadenopathy:     Cervical: No cervical adenopathy.  Skin:    General: Skin is warm and dry.  Neurological:     Mental Status: She is alert.  Psychiatric:        Mood and Affect: Mood normal.        Behavior: Behavior normal.     Assessment and Plan   Health Maintenance counseling: 1. Anticipatory guidance: Patient counseled regarding regular dental exams q6 months, eye exams,  avoiding smoking and second hand smoke, limiting alcohol to 1 beverage per day, no illicit drugs.   2. Risk factor reduction:  Advised patient of need for regular exercise and diet rich with fruits and vegetables to reduce risk of heart attack and stroke. Wt Readings from Last 3 Encounters:  08/31/24 153 lb 6.4 oz (69.6 kg)  08/24/20 145  lb 12.8 oz (66.1 kg)  11/30/19 142 lb 8 oz (64.6 kg)   3. Immunizations/screenings/ancillary studies Immunization History  Administered Date(s) Administered   DTaP 11/23/1999, 01/24/2000, 04/18/2000, 01/07/2001, 10/14/2003   DTaP / Hep B / IPV 11/23/1999, 01/24/2000, 07/09/2000, 10/14/2003   HIB (PRP-OMP) 11/23/1999, 01/24/2000, 04/18/2000, 01/07/2001   HPV Quadrivalent 10/17/2010, 12/18/2010, 10/28/2011   Hepatitis A 10/14/2005, 10/17/2006   Hepatitis B 01-Sep-2000, 11/23/1999, 07/09/2000   Hpv-Unspecified 10/17/2010, 12/18/2010   Influenza,inj,Quad PF,6+ Mos 06/16/2018, 05/12/2019, 06/09/2020   Influenza-Unspecified 06/24/2017  MMR 10/15/2000, 10/14/2003   Meningococcal B, OMV 12/16/2016, 01/09/2018   Meningococcal polysaccharide vaccine (MPSV4) 10/17/2010, 12/16/2016   PFIZER(Purple Top)SARS-COV-2 Vaccination 01/01/2020, 01/24/2020   PPD Test 01/23/2021, 01/31/2021   Pneumococcal Conjugate-13 10/17/2006   Td 10/17/2010   Tdap 10/17/2010   Varicella 10/15/2000, 10/14/2005   Health Maintenance Due  Topic Date Due   HIV Screening  Never done   Cervical Cancer Screening (Pap smear)  Never done   DTaP/Tdap/Td (8 - Td or Tdap) 10/17/2020   Influenza Vaccine  04/09/2024    4. Cervical cancer screening: 2025, normal  5. Skin cancer screening- advised regular sunscreen use. Denies worrisome, changing, or new skin lesions.  6. Birth control/STD check: OCP 7. Smoking associated screening: non- smoker 8. Alcohol screening:  socially 9. Exercise:  walks dog daily Assessment & Plan General Health Maintenance Annual physical exam completed. Anxiety and depression managed by another provider. Regular exercise and hydration maintained. No smoking or significant alcohol use. Regular menstrual cycles with Yaz. - Ordered full metabolic panel, liver function tests, kidney function tests, cholesterol panel, thyroid function tests, and complete blood count. - Encouraged continuation of regular  exercise routine. - Advised on maintaining hydration with 2 to 2.5 liters of water or caffeine-free beverages daily. - Discussed benefits of regular exercise for heart health, stress reduction, sleep improvement, and potential cancer and dementia prevention. - Encouraged routine dental care with twice daily flossing or water picking. - Discussed option for virtual visits for non-urgent issues.  Recommended follow up:  Return for any future concerns, Complete physical w/fasting labs. No future appointments.  Lab/Order associations:  fasting   Hugo Lybrand, [place], NP      [1] No Known Allergies  "

## 2024-09-01 LAB — HIV ANTIBODY (ROUTINE TESTING W REFLEX)
HIV 1&2 Ab, 4th Generation: NONREACTIVE
HIV FINAL INTERPRETATION: NEGATIVE

## 2024-09-01 LAB — HEPATITIS C ANTIBODY: Hepatitis C Ab: NONREACTIVE

## 2024-09-03 ENCOUNTER — Ambulatory Visit: Payer: Self-pay | Admitting: Family

## 2024-09-26 ENCOUNTER — Other Ambulatory Visit: Payer: Self-pay | Admitting: Medical Genetics

## 2024-12-10 ENCOUNTER — Other Ambulatory Visit (HOSPITAL_COMMUNITY)

## 2025-09-05 ENCOUNTER — Encounter: Admitting: Family
